# Patient Record
Sex: Female | Born: 1985 | Race: Black or African American | Hispanic: No | Marital: Single | Smoking: Former smoker
Health system: Southern US, Community
[De-identification: ages and names within clinical notes are randomized; demographics above are authoritative.]

## PROBLEM LIST (undated history)

## (undated) DIAGNOSIS — J45909 Unspecified asthma, uncomplicated: Secondary | ICD-10-CM

## (undated) DIAGNOSIS — A749 Chlamydial infection, unspecified: Secondary | ICD-10-CM

## (undated) HISTORY — DX: Chlamydial infection, unspecified: A74.9

## (undated) HISTORY — DX: Unspecified asthma, uncomplicated: J45.909

## (undated) HISTORY — PX: NO PAST SURGERIES: SHX2092

## (undated) HISTORY — PX: INDUCED ABORTION: SHX677

---

## 2004-06-29 ENCOUNTER — Ambulatory Visit (HOSPITAL_COMMUNITY): Admission: RE | Admit: 2004-06-29 | Discharge: 2004-06-29 | Payer: Self-pay | Admitting: Obstetrics & Gynecology

## 2005-04-26 ENCOUNTER — Inpatient Hospital Stay (HOSPITAL_COMMUNITY): Admission: AD | Admit: 2005-04-26 | Discharge: 2005-04-26 | Payer: Self-pay | Admitting: Obstetrics and Gynecology

## 2005-08-02 LAB — CONVERTED CEMR LAB: Pap Smear: NORMAL

## 2005-11-19 ENCOUNTER — Inpatient Hospital Stay (HOSPITAL_COMMUNITY): Admission: AD | Admit: 2005-11-19 | Discharge: 2005-11-19 | Payer: Self-pay | Admitting: *Deleted

## 2006-02-28 ENCOUNTER — Ambulatory Visit (HOSPITAL_COMMUNITY): Admission: RE | Admit: 2006-02-28 | Discharge: 2006-02-28 | Payer: Self-pay | Admitting: Obstetrics

## 2006-03-26 ENCOUNTER — Inpatient Hospital Stay (HOSPITAL_COMMUNITY): Admission: AD | Admit: 2006-03-26 | Discharge: 2006-03-26 | Payer: Self-pay | Admitting: Obstetrics & Gynecology

## 2006-03-27 ENCOUNTER — Inpatient Hospital Stay (HOSPITAL_COMMUNITY): Admission: AD | Admit: 2006-03-27 | Discharge: 2006-03-27 | Payer: Self-pay | Admitting: Obstetrics & Gynecology

## 2006-05-18 ENCOUNTER — Inpatient Hospital Stay (HOSPITAL_COMMUNITY): Admission: AD | Admit: 2006-05-18 | Discharge: 2006-05-18 | Payer: Self-pay | Admitting: Obstetrics

## 2006-07-15 ENCOUNTER — Inpatient Hospital Stay (HOSPITAL_COMMUNITY): Admission: AD | Admit: 2006-07-15 | Discharge: 2006-07-15 | Payer: Self-pay | Admitting: Obstetrics

## 2006-07-17 ENCOUNTER — Inpatient Hospital Stay (HOSPITAL_COMMUNITY): Admission: AD | Admit: 2006-07-17 | Discharge: 2006-07-17 | Payer: Self-pay | Admitting: Obstetrics

## 2006-07-18 ENCOUNTER — Inpatient Hospital Stay (HOSPITAL_COMMUNITY): Admission: AD | Admit: 2006-07-18 | Discharge: 2006-07-20 | Payer: Self-pay | Admitting: Obstetrics

## 2007-06-19 ENCOUNTER — Emergency Department (HOSPITAL_COMMUNITY): Admission: EM | Admit: 2007-06-19 | Discharge: 2007-06-19 | Payer: Self-pay | Admitting: Family Medicine

## 2007-08-28 ENCOUNTER — Ambulatory Visit: Payer: Self-pay | Admitting: Family Medicine

## 2007-08-28 ENCOUNTER — Encounter (INDEPENDENT_AMBULATORY_CARE_PROVIDER_SITE_OTHER): Payer: Self-pay | Admitting: *Deleted

## 2007-08-28 DIAGNOSIS — N912 Amenorrhea, unspecified: Secondary | ICD-10-CM

## 2007-08-28 DIAGNOSIS — D509 Iron deficiency anemia, unspecified: Secondary | ICD-10-CM

## 2007-08-28 DIAGNOSIS — R519 Headache, unspecified: Secondary | ICD-10-CM | POA: Insufficient documentation

## 2007-08-28 DIAGNOSIS — M4120 Other idiopathic scoliosis, site unspecified: Secondary | ICD-10-CM | POA: Insufficient documentation

## 2007-08-28 DIAGNOSIS — R197 Diarrhea, unspecified: Secondary | ICD-10-CM | POA: Insufficient documentation

## 2007-08-28 DIAGNOSIS — R51 Headache: Secondary | ICD-10-CM

## 2007-08-28 DIAGNOSIS — J45909 Unspecified asthma, uncomplicated: Secondary | ICD-10-CM | POA: Insufficient documentation

## 2007-08-28 LAB — CONVERTED CEMR LAB: Beta hcg, urine, semiquantitative: NEGATIVE

## 2007-08-29 ENCOUNTER — Encounter: Payer: Self-pay | Admitting: Family Medicine

## 2007-08-29 LAB — CONVERTED CEMR LAB
ALT: 16 units/L (ref 0–35)
AST: 18 units/L (ref 0–37)
Albumin: 4 g/dL (ref 3.5–5.2)
Alkaline Phosphatase: 44 units/L (ref 39–117)
BUN: 4 mg/dL — ABNORMAL LOW (ref 6–23)
Basophils Absolute: 0 10*3/uL (ref 0.0–0.1)
Basophils Relative: 0.4 % (ref 0.0–1.0)
Bilirubin, Direct: 0.1 mg/dL (ref 0.0–0.3)
CO2: 30 meq/L (ref 19–32)
Calcium: 9.7 mg/dL (ref 8.4–10.5)
Chloride: 102 meq/L (ref 96–112)
Cholesterol: 133 mg/dL (ref 0–200)
Creatinine, Ser: 0.7 mg/dL (ref 0.4–1.2)
Eosinophils Absolute: 0.1 10*3/uL (ref 0.0–0.6)
Eosinophils Relative: 2.4 % (ref 0.0–5.0)
GFR calc Af Amer: 133 mL/min
GFR calc non Af Amer: 110 mL/min
Glucose, Bld: 102 mg/dL — ABNORMAL HIGH (ref 70–99)
HCT: 41.1 % (ref 36.0–46.0)
HDL: 38.4 mg/dL — ABNORMAL LOW (ref >39.0)
Hemoglobin: 13.7 g/dL (ref 12.0–15.0)
LDL Cholesterol: 85 mg/dL (ref 0–99)
Lymphocytes Relative: 43.5 % (ref 12.0–46.0)
MCHC: 33.4 g/dL (ref 30.0–36.0)
MCV: 96.7 fL (ref 78.0–100.0)
Monocytes Absolute: 0.3 10*3/uL (ref 0.2–0.7)
Monocytes Relative: 7 % (ref 3.0–11.0)
Neutro Abs: 2 10*3/uL (ref 1.4–7.7)
Neutrophils Relative %: 46.7 % (ref 43.0–77.0)
Platelets: 263 10*3/uL (ref 150–400)
Potassium: 4.4 meq/L (ref 3.5–5.1)
RBC: 4.26 M/uL (ref 3.87–5.11)
RDW: 11.8 % (ref 11.5–14.6)
Sodium: 138 meq/L (ref 135–145)
TSH: 0.54 microintl units/mL (ref 0.35–5.50)
Total Bilirubin: 0.7 mg/dL (ref 0.3–1.2)
Total CHOL/HDL Ratio: 3.5
Total Protein: 7.5 g/dL (ref 6.0–8.3)
Triglycerides: 49 mg/dL (ref 0–149)
VLDL: 10 mg/dL (ref 0–40)
WBC: 4.3 10*3/uL — ABNORMAL LOW (ref 4.5–10.5)

## 2007-08-30 ENCOUNTER — Encounter (INDEPENDENT_AMBULATORY_CARE_PROVIDER_SITE_OTHER): Payer: Self-pay | Admitting: *Deleted

## 2007-09-08 ENCOUNTER — Ambulatory Visit: Payer: Self-pay | Admitting: Family Medicine

## 2007-09-08 DIAGNOSIS — K589 Irritable bowel syndrome without diarrhea: Secondary | ICD-10-CM

## 2008-05-02 ENCOUNTER — Emergency Department (HOSPITAL_COMMUNITY): Admission: EM | Admit: 2008-05-02 | Discharge: 2008-05-02 | Payer: Self-pay | Admitting: Family Medicine

## 2008-11-18 ENCOUNTER — Emergency Department (HOSPITAL_COMMUNITY): Admission: EM | Admit: 2008-11-18 | Discharge: 2008-11-18 | Payer: Self-pay | Admitting: Emergency Medicine

## 2008-11-19 ENCOUNTER — Ambulatory Visit: Payer: Self-pay | Admitting: Family Medicine

## 2008-11-19 DIAGNOSIS — R062 Wheezing: Secondary | ICD-10-CM

## 2008-11-20 ENCOUNTER — Encounter (INDEPENDENT_AMBULATORY_CARE_PROVIDER_SITE_OTHER): Payer: Self-pay | Admitting: Internal Medicine

## 2008-11-20 ENCOUNTER — Ambulatory Visit: Payer: Self-pay | Admitting: Family Medicine

## 2008-11-20 LAB — CONVERTED CEMR LAB
Basophils Absolute: 0 10*3/uL (ref 0.0–0.1)
Basophils Relative: 0 % (ref 0.0–3.0)
Eosinophils Absolute: 0 10*3/uL (ref 0.0–0.7)
Eosinophils Relative: 0.3 % (ref 0.0–5.0)
HCT: 37.8 % (ref 36.0–46.0)
Hemoglobin: 13 g/dL (ref 12.0–15.0)
Lymphocytes Relative: 26.2 % (ref 12.0–46.0)
Lymphs Abs: 3.2 10*3/uL (ref 0.7–4.0)
MCHC: 34.5 g/dL (ref 30.0–36.0)
MCV: 97.9 fL (ref 78.0–100.0)
Monocytes Absolute: 0.7 10*3/uL (ref 0.1–1.0)
Monocytes Relative: 5.4 % (ref 3.0–12.0)
Neutro Abs: 8.2 10*3/uL — ABNORMAL HIGH (ref 1.4–7.7)
Neutrophils Relative %: 68.1 % (ref 43.0–77.0)
Platelets: 290 10*3/uL (ref 150.0–400.0)
RBC: 3.86 M/uL — ABNORMAL LOW (ref 3.87–5.11)
RDW: 11.8 % (ref 11.5–14.6)
WBC: 12.1 10*3/uL — ABNORMAL HIGH (ref 4.5–10.5)

## 2008-11-21 ENCOUNTER — Ambulatory Visit: Payer: Self-pay | Admitting: Family Medicine

## 2008-11-22 ENCOUNTER — Telehealth (INDEPENDENT_AMBULATORY_CARE_PROVIDER_SITE_OTHER): Payer: Self-pay | Admitting: Internal Medicine

## 2008-11-25 ENCOUNTER — Telehealth (INDEPENDENT_AMBULATORY_CARE_PROVIDER_SITE_OTHER): Payer: Self-pay | Admitting: Internal Medicine

## 2008-11-26 ENCOUNTER — Ambulatory Visit: Payer: Self-pay | Admitting: Family Medicine

## 2008-11-29 ENCOUNTER — Telehealth (INDEPENDENT_AMBULATORY_CARE_PROVIDER_SITE_OTHER): Payer: Self-pay | Admitting: Internal Medicine

## 2008-12-12 ENCOUNTER — Telehealth (INDEPENDENT_AMBULATORY_CARE_PROVIDER_SITE_OTHER): Payer: Self-pay | Admitting: Internal Medicine

## 2008-12-19 ENCOUNTER — Telehealth (INDEPENDENT_AMBULATORY_CARE_PROVIDER_SITE_OTHER): Payer: Self-pay | Admitting: Internal Medicine

## 2009-01-07 ENCOUNTER — Encounter (INDEPENDENT_AMBULATORY_CARE_PROVIDER_SITE_OTHER): Payer: Self-pay | Admitting: Internal Medicine

## 2009-01-13 ENCOUNTER — Telehealth: Payer: Self-pay | Admitting: Family Medicine

## 2009-01-15 ENCOUNTER — Ambulatory Visit: Payer: Self-pay | Admitting: Family Medicine

## 2009-01-29 ENCOUNTER — Telehealth (INDEPENDENT_AMBULATORY_CARE_PROVIDER_SITE_OTHER): Payer: Self-pay | Admitting: Internal Medicine

## 2011-06-30 ENCOUNTER — Emergency Department: Payer: Self-pay | Admitting: Unknown Physician Specialty

## 2011-07-29 ENCOUNTER — Encounter: Payer: Self-pay | Admitting: Physician Assistant

## 2011-08-03 ENCOUNTER — Encounter: Payer: Self-pay | Admitting: Physician Assistant

## 2011-08-07 ENCOUNTER — Ambulatory Visit: Payer: Self-pay | Admitting: Physician Assistant

## 2011-09-03 ENCOUNTER — Encounter: Payer: Self-pay | Admitting: Physician Assistant

## 2011-10-01 ENCOUNTER — Encounter: Payer: Self-pay | Admitting: Physician Assistant

## 2011-11-01 ENCOUNTER — Encounter: Payer: Self-pay | Admitting: Physician Assistant

## 2012-04-20 ENCOUNTER — Other Ambulatory Visit: Payer: Self-pay | Admitting: Obstetrics

## 2012-11-29 ENCOUNTER — Ambulatory Visit (INDEPENDENT_AMBULATORY_CARE_PROVIDER_SITE_OTHER): Payer: 59 | Admitting: Obstetrics

## 2012-11-29 ENCOUNTER — Encounter: Payer: Self-pay | Admitting: Obstetrics

## 2012-11-29 VITALS — BP 125/76 | HR 83 | Temp 97.9°F | Ht 67.5 in | Wt 148.0 lb

## 2012-11-29 DIAGNOSIS — N760 Acute vaginitis: Secondary | ICD-10-CM | POA: Insufficient documentation

## 2012-11-29 DIAGNOSIS — Z113 Encounter for screening for infections with a predominantly sexual mode of transmission: Secondary | ICD-10-CM

## 2012-11-29 MED ORDER — METRONIDAZOLE 500 MG PO TABS: 500.0000 mg | ORAL_TABLET | Freq: Two times a day (BID) | ORAL | Status: DC

## 2012-11-29 NOTE — Progress Notes (Signed)
Subjective:     Roberta Farley is a 27 y.o. female here for a problem visit.  Current complaints: vaginal discharge with odor x 1 week.  Personal health questionnaire reviewed: yes.   Gynecologic History No LMP recorded. Patient has had an injection. Contraception: Depo-Provera injections   The following portions of the patient's history were reviewed and updated as appropriate: allergies, current medications, past family history, past medical history, past social history, past surgical history and problem list.  Review of Systems Pertinent items are noted in HPI.    Objective:    General appearance: alert and no distress Abdomen: normal findings: soft, non-tender Pelvic: cervix normal in appearance, external genitalia normal, no adnexal masses or tenderness, no cervical motion tenderness, uterus normal size, shape, and consistency and vagina with thin, gray discharge    Assessment:    BV  Plan:    Education reviewed: safe sex/STD prevention and BV. Follow up in: 1 year.   Flagyl Rx

## 2012-11-30 LAB — WET PREP BY MOLECULAR PROBE
Candida species: NEGATIVE
Gardnerella vaginalis: POSITIVE — AB
Trichomonas vaginosis: NEGATIVE

## 2012-11-30 LAB — GC/CHLAMYDIA PROBE AMP
CT Probe RNA: NEGATIVE
GC Probe RNA: NEGATIVE

## 2012-11-30 NOTE — Patient Instructions (Signed)
+  BV

## 2012-12-11 ENCOUNTER — Ambulatory Visit: Payer: Self-pay | Admitting: Obstetrics

## 2012-12-18 ENCOUNTER — Ambulatory Visit (INDEPENDENT_AMBULATORY_CARE_PROVIDER_SITE_OTHER): Payer: 59 | Admitting: *Deleted

## 2012-12-18 ENCOUNTER — Encounter: Payer: Self-pay | Admitting: Obstetrics

## 2012-12-18 VITALS — BP 124/78 | HR 83 | Temp 98.0°F | Ht 68.0 in | Wt 149.0 lb

## 2012-12-18 DIAGNOSIS — Z3049 Encounter for surveillance of other contraceptives: Secondary | ICD-10-CM

## 2012-12-18 MED ORDER — MEDROXYPROGESTERONE ACETATE 150 MG/ML IM SUSP
150.0000 mg | INTRAMUSCULAR | Status: DC
  Administered 2012-12-18: 150 mg via INTRAMUSCULAR

## 2012-12-18 NOTE — Patient Instructions (Signed)
Please return to office as schedule and as needed. Please call pharmacy for refills and pick-up prescription before next appointment.  

## 2012-12-18 NOTE — Progress Notes (Signed)
Pt tolerated well. Pt given results of recent PAP and advised to schedule colposcopy.

## 2012-12-26 ENCOUNTER — Other Ambulatory Visit: Payer: Self-pay | Admitting: *Deleted

## 2012-12-26 DIAGNOSIS — B379 Candidiasis, unspecified: Secondary | ICD-10-CM

## 2012-12-26 MED ORDER — FLUCONAZOLE 150 MG PO TABS: 150.0000 mg | ORAL_TABLET | Freq: Once | ORAL | Status: DC

## 2012-12-26 NOTE — Progress Notes (Signed)
Patient called regarding a yeast infection.  Patient says that she has an abnormal discharge with vaginal irritation and itching.  Per patient request - Diflucan 150mg  PO X 1 sent to [facility].

## 2013-01-15 ENCOUNTER — Encounter: Payer: Self-pay | Admitting: Obstetrics

## 2013-01-15 ENCOUNTER — Ambulatory Visit (INDEPENDENT_AMBULATORY_CARE_PROVIDER_SITE_OTHER): Payer: 59 | Admitting: Obstetrics

## 2013-01-15 VITALS — BP 119/77 | HR 89 | Temp 98.8°F | Ht 68.0 in | Wt 148.0 lb

## 2013-01-15 DIAGNOSIS — N760 Acute vaginitis: Secondary | ICD-10-CM

## 2013-01-15 DIAGNOSIS — Z3202 Encounter for pregnancy test, result negative: Secondary | ICD-10-CM

## 2013-01-15 DIAGNOSIS — R87612 Low grade squamous intraepithelial lesion on cytologic smear of cervix (LGSIL): Secondary | ICD-10-CM | POA: Insufficient documentation

## 2013-01-15 LAB — POCT URINE PREGNANCY: Preg Test, Ur: NEGATIVE

## 2013-01-15 NOTE — Progress Notes (Signed)
.  Colposcopy Procedure Note  Indications: Pap smear 4 months ago showed: low-grade squamous intraepithelial neoplasia (LGSIL - encompassing HPV,mild dysplasia,CIN I). The prior pap showed no abnormalities.  Prior cervical/vaginal disease: normal exam without visible pathology. Prior cervical treatment: no treatment.  Procedure Details  The risks and benefits of the procedure and Written informed consent obtained.  A time-out was performed confirming the patient, procedure and allergy status  Speculum placed in vagina and excellent visualization of cervix achieved, cervix swabbed x 3 with acetic acid solution.  Findings: Cervix: no visible lesions; SCJ visualized 360 degrees without lesions, endocervical curettage performed, cervical biopsies taken at 3 and 12 o'clock, specimen labelled and sent to pathology and hemostasis achieved with silver nitrate.   Vaginal inspection: normal without visible lesions. Vulvar colposcopy: normal mucosa without lesions.    Specimens: ECC and Cervical Biopsies  Complications: none.  Plan: Specimens labelled and sent to Pathology. Will base further treatment on Pathology findings. Post biopsy instructions given to patient. Return to discuss Pathology results in 2 weeks. LMP- 01/01/13.

## 2013-01-15 NOTE — Addendum Note (Signed)
Addended by: Julaine Hua on: 01/15/2013 05:57 PM   Modules accepted: Orders

## 2013-01-17 LAB — WET PREP BY MOLECULAR PROBE
Candida species: NEGATIVE
Gardnerella vaginalis: NEGATIVE
Trichomonas vaginosis: NEGATIVE

## 2013-01-25 ENCOUNTER — Encounter: Payer: Self-pay | Admitting: Obstetrics

## 2013-01-25 ENCOUNTER — Ambulatory Visit (INDEPENDENT_AMBULATORY_CARE_PROVIDER_SITE_OTHER): Payer: 59 | Admitting: Obstetrics

## 2013-01-25 VITALS — BP 129/85 | HR 58 | Temp 98.5°F | Wt 146.8 lb

## 2013-01-25 DIAGNOSIS — B9689 Other specified bacterial agents as the cause of diseases classified elsewhere: Secondary | ICD-10-CM

## 2013-01-25 DIAGNOSIS — N870 Mild cervical dysplasia: Secondary | ICD-10-CM | POA: Insufficient documentation

## 2013-01-25 DIAGNOSIS — N760 Acute vaginitis: Secondary | ICD-10-CM

## 2013-01-25 DIAGNOSIS — A499 Bacterial infection, unspecified: Secondary | ICD-10-CM

## 2013-01-25 DIAGNOSIS — R109 Unspecified abdominal pain: Secondary | ICD-10-CM

## 2013-01-25 LAB — POCT URINALYSIS DIPSTICK
Bilirubin, UA: NEGATIVE
Glucose, UA: NEGATIVE
Ketones, UA: NEGATIVE
Leukocytes, UA: NEGATIVE
Spec Grav, UA: 1.02
Urobilinogen, UA: NEGATIVE
pH, UA: 5.5

## 2013-01-25 MED ORDER — METRONIDAZOLE 500 MG PO TABS: 500.0000 mg | ORAL_TABLET | Freq: Two times a day (BID) | ORAL | Status: DC

## 2013-01-25 MED ORDER — DOXYCYCLINE HYCLATE 50 MG PO CAPS: 50.0000 mg | ORAL_CAPSULE | Freq: Two times a day (BID) | ORAL | Status: DC

## 2013-01-25 NOTE — Progress Notes (Signed)
Subjective:     Roberta Farley is a 27 y.o. female here for problem visit.  Current complaints: vaginal bleeding and intermittent lower abdominal pain x 10 days.  Personal health questionnaire reviewed: not asked.   Gynecologic History No LMP recorded. Contraception: Depo-Provera injections   Obstetric History OB History   Grav Para Term Preterm Abortions TAB SAB Ect Mult Living   1              # Outc Date GA Lbr Len/2nd Wgt Sex Del Anes PTL Lv   1 GRA                The following portions of the patient's history were reviewed and updated as appropriate: allergies, current medications, past family history, past medical history, past social history, past surgical history and problem list.  Review of Systems Pertinent items are noted in HPI.    Objective:    General appearance: alert and no distress Abdomen: normal findings: soft, non-tender Pelvic: cervix normal in appearance, external genitalia normal, no adnexal masses or tenderness, no cervical motion tenderness, uterus normal size, shape, and consistency and vagina normal without discharge    Assessment:    Healthy female exam.   H/O CIN 1.    BV   Plan:    Education reviewed: safe sex/STD prevention and management of dysplasia. Follow up in: 1 year.   Pap and Annual. Doxycycline / Flagyl Rx.

## 2013-02-01 ENCOUNTER — Ambulatory Visit: Payer: 59 | Admitting: Obstetrics

## 2013-03-12 ENCOUNTER — Ambulatory Visit: Payer: 59

## 2013-05-30 ENCOUNTER — Emergency Department (INDEPENDENT_AMBULATORY_CARE_PROVIDER_SITE_OTHER)
Admission: EM | Admit: 2013-05-30 | Discharge: 2013-05-30 | Disposition: A | Discharge status: Home / Self Care | Payer: 59 | Source: Home / Self Care | Attending: Family Medicine | Admitting: Family Medicine

## 2013-05-30 ENCOUNTER — Encounter (HOSPITAL_COMMUNITY): Payer: Self-pay | Admitting: Emergency Medicine

## 2013-05-30 DIAGNOSIS — S83002A Unspecified subluxation of left patella, initial encounter: Secondary | ICD-10-CM

## 2013-05-30 DIAGNOSIS — S83006A Unspecified dislocation of unspecified patella, initial encounter: Secondary | ICD-10-CM

## 2013-05-30 MED ORDER — MELOXICAM 7.5 MG PO TABS: 7.5000 mg | ORAL_TABLET | Freq: Two times a day (BID) | ORAL | Status: DC
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
## 2013-05-30 NOTE — ED Notes (Signed)
C/o knee pain which started today.   As patient was trying stand up a sharp came through knee which  Made patient unable to stand.  Pain lasted for thirty minutes.  Ibuprofen and ice treatments  Was used with therapy of 15 minutes iced and 15 minutes un iced.

## 2013-06-11 ENCOUNTER — Encounter: Payer: Self-pay | Admitting: Physician Assistant

## 2013-06-11 ENCOUNTER — Ambulatory Visit (INDEPENDENT_AMBULATORY_CARE_PROVIDER_SITE_OTHER): Payer: 59 | Admitting: Physician Assistant

## 2013-06-11 VITALS — BP 110/70 | HR 91 | Temp 98.8°F | Ht 68.0 in | Wt 160.0 lb

## 2013-06-11 DIAGNOSIS — Z299 Encounter for prophylactic measures, unspecified: Secondary | ICD-10-CM

## 2013-06-11 DIAGNOSIS — N926 Irregular menstruation, unspecified: Secondary | ICD-10-CM

## 2013-06-11 DIAGNOSIS — Z7189 Other specified counseling: Secondary | ICD-10-CM

## 2013-06-11 DIAGNOSIS — J45909 Unspecified asthma, uncomplicated: Secondary | ICD-10-CM

## 2013-06-11 DIAGNOSIS — K589 Irritable bowel syndrome without diarrhea: Secondary | ICD-10-CM

## 2013-06-11 DIAGNOSIS — N949 Unspecified condition associated with female genital organs and menstrual cycle: Secondary | ICD-10-CM

## 2013-06-11 DIAGNOSIS — M4120 Other idiopathic scoliosis, site unspecified: Secondary | ICD-10-CM

## 2013-06-11 DIAGNOSIS — Z7689 Persons encountering health services in other specified circumstances: Secondary | ICD-10-CM

## 2013-06-11 MED ORDER — ALBUTEROL SULFATE HFA 108 (90 BASE) MCG/ACT IN AERS: 1.0000 | INHALATION_SPRAY | Freq: Four times a day (QID) | RESPIRATORY_TRACT | Status: DC | PRN

## 2013-06-11 NOTE — Assessment & Plan Note (Signed)
Encourage probiotic and fiber supplement.  Will obtain CBC, BMP, TSH to assess other causes of constipation.

## 2013-06-11 NOTE — Progress Notes (Signed)
Patient ID: Roberta Farley, female   DOB: 03-Oct-1985, 27 y.o.   MRN: [id number]  Patient presents to clinic today to establish care.  Acute Concerns: Patient is requesting wellness forms to be filled out for her work upon completion of lab tests.  Chronic Issues: (1) Asthma -- mild, intermittent.  Is out of albuterol inhaler.  States she has not needed medication in over half a year.  Endorses flares 2 x year.  No nighttime symptoms.  (2) Irritable Bowel Syndrome -- constipation predominant.  Endorses eating healthy.  Does not take probiotic or fiber supplementation.  Patient occasionally uses stool softener or laxative for bowel movement.  Denies hematochezia, melena or tenesmus.  Abdominal discomfort is diffuse and improves with defecation.  (3) Scoliosis -- moderate-severe.  Followed by orthopedics.  Refraining from surgery due to location of curvature.  Health Maintenance: Dental -- has appointment today Vision -- UTD Immunizations -- UTD.  Tetanus 2012.  Flu shot last week.   Past Medical History  Diagnosis Date  . Chlamydia   . Asthma     Current Outpatient Prescriptions on File Prior to Visit  Medication Sig Dispense Refill  . fluconazole (DIFLUCAN) 150 MG tablet Take 1 tablet (150 mg total) by mouth once.  1 tablet  1  . meloxicam (MOBIC) 7.5 MG tablet Take 1 tablet (7.5 mg total) by mouth 2 (two) times daily after a meal.  30 tablet  1  . prenatal vitamin w/FE, FA (PRENATAL 1 + 1) 27-1 MG TABS Take 1 tablet by mouth daily.        No current facility-administered medications on file prior to visit.    No Known Allergies  Family History  Problem Relation Age of Onset  . Hypertension    . Congestive Heart Failure    . Diabetes    . Ovarian cancer Maternal Grandmother   . Cancer Maternal Grandmother   . Diabetes Mother   . Hypertension Mother   . Hyperlipidemia Father   . Diabetes Father     History   Social History  . Marital Status: Single    Spouse  Name: N/A    Number of Children: 1  . Years of Education: N/A   Occupational History  . CNA    Social History Main Topics  . Smoking status: Former Smoker    Quit date: 06/11/2013  . Smokeless tobacco: Never Used     Comment: "Hookah" tobacco    . Alcohol Use: Yes     Comment: "socially"  . Drug Use: No  . Sexual Activity: Yes    Partners: Male    Birth Control/ Protection: Injection, None   Other Topics Concern  . None   Social History Narrative  . None   Review of Systems  Constitutional: Negative for fever, chills, weight loss and malaise/fatigue.  HENT: Negative for ear discharge, ear pain, hearing loss and tinnitus.   Eyes: Negative for blurred vision, double vision, photophobia and pain.  Respiratory: Negative for cough, shortness of breath and wheezing.   Cardiovascular: Negative for chest pain and palpitations.  Gastrointestinal: Positive for abdominal pain and constipation. Negative for heartburn, nausea, vomiting, diarrhea, blood in stool and melena.  Genitourinary: Negative for dysuria, urgency, frequency, hematuria and flank pain.  Neurological: Negative for dizziness, seizures, loss of consciousness and headaches.  Endo/Heme/Allergies: Positive for environmental allergies.  Psychiatric/Behavioral: Negative for depression, suicidal ideas, hallucinations and substance abuse. The patient is not nervous/anxious and does not have insomnia.  Filed Vitals:   06/11/13 1313  BP: 110/70  Pulse: 91  Temp: 98.8 F (37.1 C)    Physical Exam  Vitals reviewed. Constitutional: She is oriented to person, place, and time and well-developed, well-nourished, and in no distress.  HENT:  Head: Normocephalic and atraumatic.  Right Ear: External ear normal.  Left Ear: External ear normal.  Nose: Nose normal.  Mouth/Throat: Oropharynx is clear and moist. No oropharyngeal exudate.  Tympanic membranes within normal limits.  Eyes: Conjunctivae and EOM are normal. Pupils are  equal, round, and reactive to light.  Neck: Normal range of motion. Neck supple. No thyromegaly present.  Cardiovascular: Normal rate, regular rhythm, normal heart sounds and intact distal pulses.   Pulmonary/Chest: Effort normal and breath sounds normal. No respiratory distress. She has no wheezes. She has no rales. She exhibits no tenderness.  Abdominal: Soft. Bowel sounds are normal. She exhibits no distension and no mass. There is no tenderness. There is no rebound and no guarding.  Musculoskeletal:  Moderate scoliosis of spine noted on exam.  Lymphadenopathy:    She has no cervical adenopathy.  Neurological: She is alert and oriented to person, place, and time. No cranial nerve deficit.  Skin: Skin is warm and dry. No rash noted.  Psychiatric: Affect normal.   Assessment/Plan: ASTHMA, INTERMITTENT, MILD Refill albuterol inhaler.  IBS Encourage probiotic and fiber supplement.  Will obtain CBC, BMP, TSH to assess other causes of constipation.  SCOLIOSIS, THORACIC SPINE Followed by Orthopedics.  Encounter to establish care Will obtain fasting labs to also include urine pregnancy and serum nicotine for patient's work wellness forms.  Health maintenance UTD.

## 2013-06-11 NOTE — Assessment & Plan Note (Signed)
Refill albuterol inhaler ?

## 2013-06-11 NOTE — Patient Instructions (Signed)
Please return for labs.  I will call you with your results.  I will also fill out your wellness forms once I have your results.  Please begin a probiotic (Align) and fiber supplement to help with stomach.  It was a pleasure participating in your care today.

## 2013-06-11 NOTE — Assessment & Plan Note (Signed)
-  Followed by Orthopedics. ?

## 2013-06-11 NOTE — Progress Notes (Signed)
Pre visit review using our clinic review tool, if applicable. No additional management support is needed unless otherwise documented below in the visit note. 

## 2013-06-11 NOTE — Assessment & Plan Note (Signed)
Will obtain fasting labs to also include urine pregnancy and serum nicotine for patient's work wellness forms.  Health maintenance UTD.

## 2013-06-12 LAB — CBC WITH DIFFERENTIAL/PLATELET
Basophils Absolute: 0 10*3/uL (ref 0.0–0.1)
Basophils Relative: 0 % (ref 0–1)
Eosinophils Absolute: 0.1 10*3/uL (ref 0.0–0.7)
Eosinophils Relative: 2 % (ref 0–5)
HCT: 38.7 % (ref 36.0–46.0)
Hemoglobin: 13.3 g/dL (ref 12.0–15.0)
Lymphocytes Relative: 43 % (ref 12–46)
Lymphs Abs: 1.8 10*3/uL (ref 0.7–4.0)
MCH: 32.4 pg (ref 26.0–34.0)
MCHC: 34.4 g/dL (ref 30.0–36.0)
MCV: 94.2 fL (ref 78.0–100.0)
Monocytes Absolute: 0.2 10*3/uL (ref 0.1–1.0)
Monocytes Relative: 4 % (ref 3–12)
Neutro Abs: 2.1 10*3/uL (ref 1.7–7.7)
Neutrophils Relative %: 51 % (ref 43–77)
Platelets: 255 10*3/uL (ref 150–400)
RBC: 4.11 MIL/uL (ref 3.87–5.11)
RDW: 12.7 % (ref 11.5–15.5)
WBC: 4.2 10*3/uL (ref 4.0–10.5)

## 2013-06-12 LAB — BASIC METABOLIC PANEL
BUN: 9 mg/dL (ref 6–23)
CO2: 25 mEq/L (ref 19–32)
Calcium: 9.2 mg/dL (ref 8.4–10.5)
Chloride: 105 mEq/L (ref 96–112)
Creat: 0.85 mg/dL (ref 0.50–1.10)
Glucose, Bld: 107 mg/dL — ABNORMAL HIGH (ref 70–99)
Potassium: 4.4 mEq/L (ref 3.5–5.3)
Sodium: 138 mEq/L (ref 135–145)

## 2013-06-12 LAB — HEPATIC FUNCTION PANEL
ALT: 16 U/L (ref 0–35)
AST: 18 U/L (ref 0–37)
Albumin: 4.3 g/dL (ref 3.5–5.2)
Alkaline Phosphatase: 36 U/L — ABNORMAL LOW (ref 39–117)
Bilirubin, Direct: 0.2 mg/dL (ref 0.0–0.3)
Indirect Bilirubin: 0.4 mg/dL (ref 0.0–0.9)
Total Bilirubin: 0.6 mg/dL (ref 0.3–1.2)
Total Protein: 6.9 g/dL (ref 6.0–8.3)

## 2013-06-12 LAB — HEMOGLOBIN A1C
Hgb A1c MFr Bld: 5.6 % (ref <5.7)
Mean Plasma Glucose: 114 mg/dL (ref <117)

## 2013-06-12 LAB — LIPID PANEL
Cholesterol: 125 mg/dL (ref 0–200)
HDL: 50 mg/dL (ref >39)
LDL Cholesterol: 67 mg/dL (ref 0–99)
Total CHOL/HDL Ratio: 2.5 Ratio
Triglycerides: 40 mg/dL (ref <150)
VLDL: 8 mg/dL (ref 0–40)

## 2013-06-12 LAB — TSH: TSH: 0.632 u[IU]/mL (ref 0.350–4.500)

## 2013-06-13 ENCOUNTER — Telehealth: Payer: Self-pay

## 2013-06-13 LAB — URINALYSIS, ROUTINE W REFLEX MICROSCOPIC
Bilirubin Urine: NEGATIVE
Glucose, UA: NEGATIVE mg/dL
Leukocytes, UA: NEGATIVE
Nitrite: NEGATIVE
Protein, ur: 100 mg/dL — AB
Specific Gravity, Urine: 1.022 (ref 1.005–1.030)
Urobilinogen, UA: 1 mg/dL (ref 0.0–1.0)
pH: 6 (ref 5.0–8.0)

## 2013-06-13 LAB — NICOTINE/COTININE METABOLITES: Cotinine: <10 ng/mL

## 2013-06-13 LAB — URINALYSIS, MICROSCOPIC ONLY: Casts: NONE SEEN

## 2013-06-13 LAB — PREGNANCY, URINE: Preg Test, Ur: NEGATIVE

## 2013-06-13 NOTE — Telephone Encounter (Signed)
Patient informed of results. Patient stated that she has not started her period since last office visit. Patient instructed to make another appointment in 1-2 weeks as long as she is not on her period for her urine to be rechecked.

## 2013-06-13 NOTE — Telephone Encounter (Signed)
Message copied by Eulis Manly on Wed Jun 13, 2013 10:44 AM ------      Message from: Marcelline Mates      Created: Wed Jun 13, 2013  8:20 AM       Please inform patient that overall her labs look good.  I am still waiting on the result of nicotine levels.  Her urine pregnancy was negative.  However, her urine showed a moderate amount of blood under the microscope.  I am not sure if she has started her period since last visit.  I would like for her to return in 1-2 weeks (while not having period) for [place] to recheck her urine to make sure it is clear of blood. To reassure her, most of the time when we recheck urine it is normal. ------

## 2013-06-13 NOTE — Telephone Encounter (Signed)
Left message for patient requesting a call back to discuss labs.

## 2013-06-14 ENCOUNTER — Telehealth: Payer: Self-pay | Admitting: Physician Assistant

## 2013-06-14 ENCOUNTER — Other Ambulatory Visit: Payer: Self-pay | Admitting: Physician Assistant

## 2013-06-14 DIAGNOSIS — Z299 Encounter for prophylactic measures, unspecified: Secondary | ICD-10-CM

## 2013-06-14 NOTE — Telephone Encounter (Signed)
Reorder of serum nicotine/continine metabolites due to error with specimen handling by Lab.

## 2013-06-15 ENCOUNTER — Telehealth: Payer: Self-pay | Admitting: *Deleted

## 2013-06-15 NOTE — Telephone Encounter (Signed)
Patient called RE: lab results being faxed to Insurance; per provider, pt wanted to keep form until results were in & also pt has been contacted by lab to return for another draw on nicotine lab d/t courier error on first test. [facility] with contact name and number RE: this matter/SLS

## 2013-06-22 LAB — NICOTINE/COTININE METABOLITES: Cotinine: <10 ng/mL

## 2013-07-10 ENCOUNTER — Telehealth: Payer: Self-pay | Admitting: *Deleted

## 2013-07-10 DIAGNOSIS — R319 Hematuria, unspecified: Secondary | ICD-10-CM

## 2013-07-10 NOTE — Telephone Encounter (Signed)
Pt returned your call.  

## 2013-07-10 NOTE — Telephone Encounter (Signed)
Received message from pt requesting lab results from November.  Attempted to reach pt and reached recording that voice mailbox is full.

## 2013-07-13 NOTE — Telephone Encounter (Signed)
Spoke with pt. She states she was calling to follow up on her repeat urine that she provided to the lab on 06/21/13.  Apologized to pt as repeat urine testing was not ordered or resulted. Pt states she will return Monday to provide another sample as she still has not had a menstrual cycle since her last visit on 06/11/13. Pt advised to ask for me on Monday to ensure urinalysis is ordered.

## 2013-07-14 ENCOUNTER — Encounter: Payer: Self-pay | Admitting: Internal Medicine

## 2013-07-14 ENCOUNTER — Ambulatory Visit (INDEPENDENT_AMBULATORY_CARE_PROVIDER_SITE_OTHER): Payer: 59 | Admitting: Internal Medicine

## 2013-07-14 VITALS — BP 120/80 | HR 64 | Temp 99.6°F | Ht 68.0 in | Wt 157.0 lb

## 2013-07-14 DIAGNOSIS — J45909 Unspecified asthma, uncomplicated: Secondary | ICD-10-CM

## 2013-07-14 DIAGNOSIS — J019 Acute sinusitis, unspecified: Secondary | ICD-10-CM | POA: Insufficient documentation

## 2013-07-14 MED ORDER — AMOXICILLIN 500 MG PO TABS: 1000.0000 mg | ORAL_TABLET | Freq: Two times a day (BID) | ORAL | Status: DC

## 2013-07-14 MED ORDER — ALBUTEROL SULFATE HFA 108 (90 BASE) MCG/ACT IN AERS: 1.0000 | INHALATION_SPRAY | Freq: Four times a day (QID) | RESPIRATORY_TRACT | Status: DC | PRN

## 2013-07-14 NOTE — Progress Notes (Signed)
   Subjective:    Patient ID: Roberta Farley, female    DOB: 02/16/1986, 27 y.o.   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 meloxicam (MOBIC) 7.5 MG tablet Take 1 tablet (7.5 mg total) by mouth 2 (two) times daily after a meal.  30 tablet  1  . prenatal vitamin w/FE, FA (PRENATAL 1 + 1) 27-1 MG TABS Take 1 tablet by mouth daily.       Marland Kitchen albuterol (PROVENTIL HFA;VENTOLIN HFA) 108 (90 BASE) MCG/ACT inhaler Inhale 1-2 puffs into the lungs every 6 (six) hours as needed for wheezing or shortness of breath.  1 Inhaler  0   No current facility-administered medications on file prior to visit.    No Known Allergies  Past Medical History  Diagnosis Date  . Chlamydia   . Asthma     Past Surgical History  Procedure Laterality Date  . Induced abortion      x 2    Family History  Problem Relation Age of Onset  . Hypertension    . Congestive Heart Failure    . Diabetes    . Ovarian cancer Maternal Grandmother   . Cancer Maternal Grandmother   . Diabetes Mother   . Hypertension Mother   . Hyperlipidemia Father   . Diabetes Father     History   Social History  . Marital Status: Single    Spouse Name: N/A    Number of Children: 1  . Years of Education: N/A   Occupational History  . CNA    Social History Main Topics  . Smoking status: Former Smoker    Quit date: 06/11/2013  . Smokeless tobacco: Never Used     Comment: "Hookah" tobacco    . Alcohol Use: Yes     Comment: "socially"  . Drug Use: No  . Sexual Activity: Yes    Partners: Male    Birth Control/ Protection: Injection, None    Other Topics Concern  . Not on file   Social History Narrative  . No narrative on file   Review of Systems No rash No diarrhea--actually pain with constipation last night Appetite is decreased    Objective:   Physical Exam  Constitutional: She appears well-developed and well-nourished. No distress.  HENT:  Mouth/Throat: Oropharynx is clear and moist. No oropharyngeal exudate.  Mild left frontal tenderness TMs normal Mild nasal inflammation  Neck: Normal range of motion. Neck supple.  Pulmonary/Chest: Effort normal and breath sounds normal. No respiratory distress. She has no wheezes. She has no rales.  Lymphadenopathy:    She has no cervical adenopathy.          Assessment & Plan:

## 2013-07-14 NOTE — Assessment & Plan Note (Signed)
Not particularly exacerbated Will refill the albuterol

## 2013-07-14 NOTE — Progress Notes (Signed)
Pre-visit discussion using our clinic review tool. No additional management support is needed unless otherwise documented below in the visit note.  

## 2013-07-14 NOTE — Assessment & Plan Note (Signed)
Likely still viral Discussed symptomatic Rx If worsens, can fill amoxicillin Rx

## 2013-07-18 NOTE — Telephone Encounter (Signed)
U/A w/micro order placed and forwarded to [facility] for pt return draw/SLS

## 2014-01-15 ENCOUNTER — Encounter (HOSPITAL_COMMUNITY): Payer: Self-pay | Admitting: Emergency Medicine

## 2014-01-15 ENCOUNTER — Inpatient Hospital Stay (HOSPITAL_COMMUNITY)
Admission: EM | Admit: 2014-01-15 | Discharge: 2014-01-19 | DRG: 872 | Disposition: A | Discharge status: Home / Self Care | Payer: 59 | Attending: Internal Medicine | Admitting: Internal Medicine

## 2014-01-15 ENCOUNTER — Inpatient Hospital Stay (HOSPITAL_COMMUNITY): Payer: 59

## 2014-01-15 DIAGNOSIS — J45909 Unspecified asthma, uncomplicated: Secondary | ICD-10-CM | POA: Diagnosis present

## 2014-01-15 DIAGNOSIS — N12 Tubulo-interstitial nephritis, not specified as acute or chronic: Secondary | ICD-10-CM | POA: Diagnosis present

## 2014-01-15 DIAGNOSIS — Z87891 Personal history of nicotine dependence: Secondary | ICD-10-CM

## 2014-01-15 DIAGNOSIS — A419 Sepsis, unspecified organism: Principal | ICD-10-CM | POA: Diagnosis present

## 2014-01-15 DIAGNOSIS — Z833 Family history of diabetes mellitus: Secondary | ICD-10-CM

## 2014-01-15 DIAGNOSIS — Z8249 Family history of ischemic heart disease and other diseases of the circulatory system: Secondary | ICD-10-CM

## 2014-01-15 DIAGNOSIS — Z6826 Body mass index (BMI) 26.0-26.9, adult: Secondary | ICD-10-CM

## 2014-01-15 DIAGNOSIS — G43909 Migraine, unspecified, not intractable, without status migrainosus: Secondary | ICD-10-CM | POA: Diagnosis present

## 2014-01-15 DIAGNOSIS — Z8041 Family history of malignant neoplasm of ovary: Secondary | ICD-10-CM

## 2014-01-15 LAB — URINALYSIS, ROUTINE W REFLEX MICROSCOPIC
Bilirubin Urine: NEGATIVE
Glucose, UA: NEGATIVE mg/dL
Ketones, ur: 40 mg/dL — AB
NITRITE: NEGATIVE
Protein, ur: 30 mg/dL — AB
SPECIFIC GRAVITY, URINE: 1.009 (ref 1.005–1.030)
UROBILINOGEN UA: 0.2 mg/dL (ref 0.0–1.0)
pH: 6.5 (ref 5.0–8.0)

## 2014-01-15 LAB — CBC WITH DIFFERENTIAL/PLATELET
BASOS ABS: 0.1 10*3/uL (ref 0.0–0.1)
Basophils Relative: 0 % (ref 0–1)
EOS PCT: 0 % (ref 0–5)
Eosinophils Absolute: 0 10*3/uL (ref 0.0–0.7)
HCT: 34.4 % — ABNORMAL LOW (ref 36.0–46.0)
Hemoglobin: 11.7 g/dL — ABNORMAL LOW (ref 12.0–15.0)
LYMPHS PCT: 5 % — AB (ref 12–46)
Lymphs Abs: 0.8 10*3/uL (ref 0.7–4.0)
MCH: 32.4 pg (ref 26.0–34.0)
MCHC: 34 g/dL (ref 30.0–36.0)
MCV: 95.3 fL (ref 78.0–100.0)
Monocytes Absolute: 1.4 10*3/uL — ABNORMAL HIGH (ref 0.1–1.0)
Monocytes Relative: 8 % (ref 3–12)
NEUTROS ABS: 15.5 10*3/uL — AB (ref 1.7–7.7)
Neutrophils Relative %: 87 % — ABNORMAL HIGH (ref 43–77)
Platelets: 228 10*3/uL (ref 150–400)
RBC: 3.61 MIL/uL — ABNORMAL LOW (ref 3.87–5.11)
RDW: 12.5 % (ref 11.5–15.5)
WBC: 17.8 10*3/uL — AB (ref 4.0–10.5)

## 2014-01-15 LAB — COMPREHENSIVE METABOLIC PANEL
ALBUMIN: 3.4 g/dL — AB (ref 3.5–5.2)
ALT: 15 U/L (ref 0–35)
AST: 19 U/L (ref 0–37)
Alkaline Phosphatase: 42 U/L (ref 39–117)
BUN: 10 mg/dL (ref 6–23)
CALCIUM: 8.9 mg/dL (ref 8.4–10.5)
CO2: 23 meq/L (ref 19–32)
Chloride: 99 mEq/L (ref 96–112)
Creatinine, Ser: 0.93 mg/dL (ref 0.50–1.10)
GFR calc Af Amer: >90 mL/min (ref >90)
GFR, EST NON AFRICAN AMERICAN: 82 mL/min — AB (ref >90)
Glucose, Bld: 131 mg/dL — ABNORMAL HIGH (ref 70–99)
Potassium: 3.8 mEq/L (ref 3.7–5.3)
SODIUM: 135 meq/L — AB (ref 137–147)
Total Bilirubin: 0.7 mg/dL (ref 0.3–1.2)
Total Protein: 7 g/dL (ref 6.0–8.3)

## 2014-01-15 LAB — URINE MICROSCOPIC-ADD ON

## 2014-01-15 LAB — I-STAT CG4 LACTIC ACID, ED: Lactic Acid, Venous: 0.85 mmol/L (ref 0.5–2.2)

## 2014-01-15 LAB — POC URINE PREG, ED: PREG TEST UR: NEGATIVE

## 2014-01-15 MED ORDER — HYDROMORPHONE HCL PF 1 MG/ML IJ SOLN
0.5000 mg | INTRAMUSCULAR | Status: DC | PRN
Start: 2014-01-15 — End: 2014-01-19
  Administered 2014-01-16 (×5): 0.5 mg via INTRAVENOUS
  Filled 2014-01-15 (×5): qty 1

## 2014-01-15 MED ORDER — ALBUTEROL SULFATE (2.5 MG/3ML) 0.083% IN NEBU: 2.5000 mg | INHALATION_SOLUTION | Freq: Four times a day (QID) | RESPIRATORY_TRACT | Status: DC | PRN

## 2014-01-15 MED ORDER — ALBUTEROL SULFATE HFA 108 (90 BASE) MCG/ACT IN AERS: 1.0000 | INHALATION_SPRAY | Freq: Four times a day (QID) | RESPIRATORY_TRACT | Status: DC | PRN

## 2014-01-15 MED ORDER — DEXTROSE 5 % IV SOLN
1.0000 g | INTRAVENOUS | Status: DC
  Administered 2014-01-15 – 2014-01-18 (×4): 1 g via INTRAVENOUS
  Filled 2014-01-15 (×4): qty 10

## 2014-01-15 MED ORDER — ACETAMINOPHEN 500 MG PO TABS
1000.0000 mg | ORAL_TABLET | Freq: Once | ORAL | Status: AC
  Administered 2014-01-15: 1000 mg via ORAL
  Filled 2014-01-15: qty 2

## 2014-01-15 MED ORDER — SODIUM CHLORIDE 0.9 % IV BOLUS (SEPSIS)
1000.0000 mL | Freq: Once | INTRAVENOUS | Status: AC
  Administered 2014-01-15: 1000 mL via INTRAVENOUS

## 2014-01-15 MED ORDER — MORPHINE SULFATE 4 MG/ML IJ SOLN
4.0000 mg | Freq: Once | INTRAMUSCULAR | Status: AC
  Administered 2014-01-15: 4 mg via INTRAVENOUS
  Filled 2014-01-15: qty 1

## 2014-01-15 MED ORDER — ONDANSETRON HCL 4 MG/2ML IJ SOLN
4.0000 mg | Freq: Once | INTRAMUSCULAR | Status: AC
  Administered 2014-01-15: 4 mg via INTRAVENOUS
  Filled 2014-01-15: qty 2

## 2014-01-15 MED ORDER — ONDANSETRON HCL 4 MG/2ML IJ SOLN: 4.0000 mg | Freq: Four times a day (QID) | INTRAMUSCULAR | Status: DC | PRN

## 2014-01-15 MED ORDER — ACETAMINOPHEN 325 MG PO TABS
650.0000 mg | ORAL_TABLET | Freq: Four times a day (QID) | ORAL | Status: DC | PRN
  Administered 2014-01-16: 325 mg via ORAL
  Administered 2014-01-16 – 2014-01-18 (×4): 650 mg via ORAL
  Filled 2014-01-15 (×5): qty 2

## 2014-01-15 MED ORDER — SENNOSIDES-DOCUSATE SODIUM 8.6-50 MG PO TABS: 1.0000 | ORAL_TABLET | Freq: Every evening | ORAL | Status: DC | PRN

## 2014-01-15 MED ORDER — ACETAMINOPHEN 650 MG RE SUPP: 650.0000 mg | Freq: Four times a day (QID) | RECTAL | Status: DC | PRN

## 2014-01-15 MED ORDER — KETOROLAC TROMETHAMINE 30 MG/ML IJ SOLN
30.0000 mg | Freq: Four times a day (QID) | INTRAMUSCULAR | Status: DC | PRN
  Administered 2014-01-17 (×2): 30 mg via INTRAVENOUS
  Filled 2014-01-15: qty 2
  Filled 2014-01-15: qty 1
  Filled 2014-01-15: qty 2

## 2014-01-15 MED ORDER — IBUPROFEN 800 MG PO TABS
800.0000 mg | ORAL_TABLET | Freq: Once | ORAL | Status: AC
  Administered 2014-01-15: 800 mg via ORAL
  Filled 2014-01-15: qty 1

## 2014-01-15 MED ORDER — MORPHINE SULFATE 4 MG/ML IJ SOLN
4.0000 mg | Freq: Once | INTRAMUSCULAR | Status: AC
Start: 2014-01-15 — End: 2014-01-15
  Administered 2014-01-15: 4 mg via INTRAVENOUS
  Filled 2014-01-15: qty 1

## 2014-01-15 MED ORDER — POTASSIUM CHLORIDE IN NACL 20-0.9 MEQ/L-% IV SOLN
INTRAVENOUS | Status: DC
  Administered 2014-01-16 (×2): via INTRAVENOUS
  Filled 2014-01-15 (×4): qty 1000

## 2014-01-15 MED ORDER — ONDANSETRON HCL 4 MG PO TABS: 4.0000 mg | ORAL_TABLET | Freq: Four times a day (QID) | ORAL | Status: DC | PRN

## 2014-01-15 MED ORDER — ENOXAPARIN SODIUM 40 MG/0.4ML ~~LOC~~ SOLN
40.0000 mg | Freq: Every day | SUBCUTANEOUS | Status: DC
  Administered 2014-01-16 – 2014-01-18 (×4): 40 mg via SUBCUTANEOUS
  Filled 2014-01-15 (×6): qty 0.4

## 2014-01-15 NOTE — ED Notes (Signed)
Patient transported to CT 

## 2014-01-15 NOTE — ED Notes (Signed)
Explained to pt we needed a sample, she said she couldn't at this time. Asked her to notify when she was able to void

## 2014-01-15 NOTE — ED Notes (Signed)
Pt states that she had an odor and blood in her urine before she took the at home UTI test which showed she had an UTI.  Pt was taking OTC meds for the UTI but still feeling bad. Pt states that she vomited 5 times today and has a fever.

## 2014-01-15 NOTE — ED Notes (Signed)
Pt stated she needed to use restroom. Pt said she couldn't ambulate and wanted the bedpan. Pt was unable to go. Was told to notify when she was able to try again.

## 2014-01-15 NOTE — ED Provider Notes (Signed)
CSN: [id number]     Arrival date & time 01/15/14  1831 History   First MD Initiated Contact with Patient 01/15/14 1853     Chief Complaint  Patient presents with  . Hematuria  . Back Pain     (Consider location/radiation/quality/duration/timing/severity/associated sxs/prior Treatment) HPI Comments: Patient is a 28 year old female with history of chlamydia and asthma who presents today with low back pain and urinary symptoms. She states her urinary symptoms began Sunday and have gradually worsened. She has frequency dysuria, urgency. She feels generally weak he feels as though she cannot walk. She had associated fever and chills. She has nausea and vomiting. No history of prior kidney stone.  Patient is a 28 y.o. female presenting with hematuria and back pain. The history is provided by the patient. No language interpreter was used.  Hematuria Associated symptoms include abdominal pain, chills, a fever, nausea, vomiting and weakness.  Back Pain Associated symptoms: abdominal pain, dysuria, fever and weakness     Past Medical History  Diagnosis Date  . Chlamydia   . Asthma    Past Surgical History  Procedure Laterality Date  . Induced abortion      x 2   Family History  Problem Relation Age of Onset  . Hypertension    . Congestive Heart Failure    . Diabetes    . Ovarian cancer Maternal Grandmother   . Cancer Maternal Grandmother   . Diabetes Mother   . Hypertension Mother   . Hyperlipidemia Father   . Diabetes Father    History  Substance Use Topics  . Smoking status: Former Smoker    Quit date: 06/11/2013  . Smokeless tobacco: Never Used     Comment: "Hookah" tobacco    . Alcohol Use: Yes     Comment: "socially"   OB History   Grav Para Term Preterm Abortions TAB SAB Ect Mult Living   1              Review of Systems  Constitutional: Positive for fever and chills.  Gastrointestinal: Positive for nausea, vomiting and abdominal pain.  Genitourinary: Positive  for dysuria, urgency, frequency, hematuria and flank pain.  Musculoskeletal: Positive for back pain.  Neurological: Positive for weakness.  All other systems reviewed and are negative.     Allergies  Review of patient's allergies indicates no known allergies.  Home Medications   Prior to Admission medications   Medication Sig Start Date End Date Taking? Authorizing Provider  albuterol (PROVENTIL HFA;VENTOLIN HFA) 108 (90 BASE) MCG/ACT inhaler Inhale 1-2 puffs into the lungs every 6 (six) hours as needed for wheezing or shortness of breath. 07/14/13   Karie Schwalbe, MD  amoxicillin (AMOXIL) 500 MG tablet Take 2 tablets (1,000 mg total) by mouth 2 (two) times daily. 07/14/13   Karie Schwalbe, MD  meloxicam (MOBIC) 7.5 MG tablet Take 1 tablet (7.5 mg total) by mouth 2 (two) times daily after a meal. 05/30/13   Linna Hoff, MD  prenatal vitamin w/FE, FA (PRENATAL 1 + 1) 27-1 MG TABS Take 1 tablet by mouth daily.  09/18/12   Historical Provider, MD   BP 105/55  Pulse 89  Temp(Src) 99 F (37.2 C) (Oral)  Resp 18  SpO2 98%  LMP 01/09/2014 Physical Exam  Nursing note and vitals reviewed. Constitutional: She is oriented to person, place, and time. She appears well-developed and well-nourished. She appears ill. No distress.  HENT:  Head: Normocephalic and atraumatic.  Right Ear:  External ear normal.  Left Ear: External ear normal.  Nose: Nose normal.  Mouth/Throat: Oropharynx is clear and moist.  Eyes: Conjunctivae are normal.  Neck: Normal range of motion.  No nuchal rigidity or meningeal signs  Cardiovascular: Regular rhythm and normal heart sounds.  Tachycardia present.   Pulmonary/Chest: Effort normal and breath sounds normal. No stridor. No respiratory distress. She has no wheezes. She has no rales.  Abdominal: Soft. She exhibits no distension. There is generalized tenderness. There is CVA tenderness. There is no rigidity and no guarding.  Musculoskeletal: Normal range of  motion.  Neurological: She is alert and oriented to person, place, and time. She has normal strength.  Skin: Skin is warm and dry. She is not diaphoretic. No erythema.  Psychiatric: She has a normal mood and affect. Her behavior is normal.    ED Course  Procedures (including critical care time) Labs Review Labs Reviewed  URINALYSIS, ROUTINE W REFLEX MICROSCOPIC - Abnormal; Notable for the following:    APPearance CLOUDY (*)    Hgb urine dipstick MODERATE (*)    Ketones, ur 40 (*)    Protein, ur 30 (*)    Leukocytes, UA LARGE (*)    All other components within normal limits  CBC WITH DIFFERENTIAL - Abnormal; Notable for the following:    WBC 17.8 (*)    RBC 3.61 (*)    Hemoglobin 11.7 (*)    HCT 34.4 (*)    Neutrophils Relative % 87 (*)    Neutro Abs 15.5 (*)    Lymphocytes Relative 5 (*)    Monocytes Absolute 1.4 (*)    All other components within normal limits  COMPREHENSIVE METABOLIC PANEL - Abnormal; Notable for the following:    Sodium 135 (*)    Glucose, Bld 131 (*)    Albumin 3.4 (*)    GFR calc non Af Amer 82 (*)    All other components within normal limits  URINE MICROSCOPIC-ADD ON - Abnormal; Notable for the following:    Squamous Epithelial / LPF FEW (*)    Bacteria, UA FEW (*)    All other components within normal limits  URINE CULTURE  POC URINE PREG, ED  I-STAT CG4 LACTIC ACID, ED    Imaging Review Ct Abdomen Pelvis Wo Contrast  01/15/2014   CLINICAL DATA:  Right-sided flank pain.  Renal calculus.  EXAM: CT ABDOMEN AND PELVIS WITHOUT CONTRAST  TECHNIQUE: Multidetector CT imaging of the abdomen and pelvis was performed following the standard protocol without IV contrast.  COMPARISON:  None.  FINDINGS: Bones:  No aggressive osseous lesions.  Lung Bases: Dependent atelectasis. Tiny bilateral pleural effusions.  Liver: Unenhanced CT was performed per clinician order. Lack of IV contrast limits sensitivity and specificity, especially for evaluation of  abdominal/pelvic solid viscera. Grossly normal.  Spleen:  Normal.  Gallbladder:  Distended.  No calcified stones.  Common bile duct:  Normal.  Pancreas:  Normal.  Adrenal glands:  Normal bilaterally.  Kidneys: The left kidney appears normal. Left ureter is also normal. There are inflammatory changes around the right kidney. No right renal calculi. Right periureteric inflammatory changes are present. There is no distal right ureteral stone identified. No stone is identified in the urinary bladder  Stomach:  Grossly normal.  Small bowel:  Grossly normal.  Colon: Large stool burden. Mobile cecum is present located anteriorly in the anatomic pelvis. The appendix is not identified.  Pelvic Genitourinary: Poor visualization of the adnexal structures in the absence of oral and  IV contrast. Urinary bladder grossly appears within normal limits.  Vasculature: Distended inferior vena cava, likely secondary to resuscitation. This may also account for the pleural effusions.  Body Wall: Fat containing tiny periumbilical hernia.  IMPRESSION: 1. No renal calculi identified. Inflammatory changes of the right kidney and ureter may relate to recent passage of stone or ascending urinary tract infection. 2. Tiny bilateral pleural effusions likely related to high volume status.   Electronically Signed   By: Andreas NewportGeoffrey  Lamke M.D.   On: 01/15/2014 23:20     EKG Interpretation None      MDM   Final diagnoses:  Pyelonephritis   Patient presents to the emergency department where pyelonephritis. She is febrile and tachycardic which responds well to antipyretics and fluid. Elevated white blood cell count at 17.8. Will start patient on rocephin and admit to medicine for further management. Admission is appreciated. Patient is hemodynamically stable. Discussed case with Dr. Fayrene FearingJames who agrees with plan. Patient / Family / Caregiver informed of clinical course, understand medical decision-making process, and agree with plan.   Mora BellmanHannah  S Merrell, PA-C 01/15/14 [id number]

## 2014-01-15 NOTE — H&P (Signed)
[facility] History and Physical  Roberta Farley ZOX:[id number] DOB: [birth year] DOA: 01/15/2014  Referring physician: EDP PCP: Piedad ClimesMartin, William Cody, PA-C   Chief Complaint: blood in urine, vomiting, back pain  HPI: Roberta Farley is a 28 y.o. female  Presents to ED with several day h/o fevers, chills, vomiting, right flank and lower abdominal pain. She's also had vague dysuria and noticed hematuria today. She feels weak. In the emergency room, her temperature was 103. Urinalysis shows blood and leukocyte esterase with too numerous to count white cells. White blood cell count is 17,000. The pain is crampy in nature.  Review of Systems:  Systems review. As above otherwise negative.  Past Medical History  Diagnosis Date  . Chlamydia   . Asthma    Past Surgical History  Procedure Laterality Date  . Induced abortion      x 2   Social History:  reports that she quit smoking about 7 months ago. She has never used smokeless tobacco. She reports that she drinks alcohol. She reports that she does not use illicit drugs. 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 Hypertension    . Congestive Heart Failure    . Diabetes    . Ovarian cancer Maternal Grandmother   . Cancer Maternal Grandmother   . Diabetes Mother   . Hypertension Mother   . Hyperlipidemia Father   . Diabetes Father      Prior to Admission medications   Medication Sig Start Date End Date Taking? Authorizing Provider  albuterol (PROVENTIL HFA;VENTOLIN HFA) 108 (90 BASE) MCG/ACT inhaler Inhale 1-2 puffs into the lungs every 6 (six) hours as needed for wheezing or shortness of breath. 07/14/13  Yes Karie Schwalbeichard I Letvak, MD  CRANBERRY PO Take 1 capsule by mouth daily.   Yes Historical Provider, MD  ibuprofen (ADVIL,MOTRIN) 200 MG tablet Take 600 mg by mouth every 6 (six) hours as needed (pain).   Yes Historical Provider, MD    Physical Exam: Filed Vitals:   01/15/14 2200  BP: 108/58  Pulse: 86  Temp:   Resp:     BP 108/58  Pulse 86  Temp(Src) 99 F (37.2 C) (Oral)  Resp 18  SpO2 93%  LMP 01/09/2014 BP 108/58  Pulse 86  Temp(Src) 99 F (37.2 C) (Oral)  Resp 18  SpO2 93%  LMP 01/09/2014  General Appearance:    Alert, uncomfortable AA female. oriented  Head:    Normocephalic, without obvious abnormality, atraumatic  Eyes:    PERRL, conjunctiva/corneas clear, EOM's intact, fundi    benign, both eyes     Nose:   Nares normal, septum midline, mucosa normal, no drainage    or sinus tenderness  Throat:   Dry mucous membranes. OP without erythema or exudate  Neck:   Supple, symmetrical, trachea midline, no adenopathy;    thyroid:  no enlargement/tenderness/nodules; no carotid   bruit or JVD  Back:     Symmetric, no curvature, mild right CVA tenderness  Lungs:     Clear to auscultation bilaterally, respirations unlabored  Chest Wall:    No tenderness or deformity   Heart:    Regular rate and rhythm, S1 and S2 normal, no murmur, rub   or gallop     Abdomen:     Soft, lower abdominal and suprapubic tenderness. Nondistended. Normal bowel sounds  Genitalia:   deferred  Rectal:    deferred  Extremities:   Extremities normal, atraumatic, no cyanosis or edema  Pulses:   2+ and symmetric all extremities  Skin:   Skin color, texture, turgor normal, no rashes or lesions  Lymph nodes:   Cervical, supraclavicular, and axillary nodes normal  Neurologic:   CNII-XII intact, normal strength, sensation and reflexes    throughout           Psych: normal affect  Labs on Admission:  Basic Metabolic Panel:  Recent Labs Lab 01/15/14 1908  NA 135*  K 3.8  CL 99  CO2 23  GLUCOSE 131*  BUN 10  CREATININE 0.93  CALCIUM 8.9   Liver Function Tests:  Recent Labs Lab 01/15/14 1908  AST 19  ALT 15  ALKPHOS 42  BILITOT 0.7  PROT 7.0  ALBUMIN 3.4*   No results found for this basename: LIPASE,  AMYLASE,  in the last 168 hours No results found for this basename: AMMONIA,  in the last 168 hours CBC:  Recent Labs Lab 01/15/14 1908  WBC 17.8*  NEUTROABS 15.5*  HGB 11.7*  HCT 34.4*  MCV 95.3  PLT 228   Urinalysis    Component Value Date/Time   COLORURINE YELLOW 01/15/2014 2115   APPEARANCEUR CLOUDY* 01/15/2014 2115   LABSPEC 1.009 01/15/2014 2115   PHURINE 6.5 01/15/2014 2115   GLUCOSEU NEGATIVE 01/15/2014 2115   HGBUR MODERATE* 01/15/2014 2115   BILIRUBINUR NEGATIVE 01/15/2014 2115   BILIRUBINUR NEGATIVE 01/25/2013 1833   KETONESUR 40* 01/15/2014 2115   PROTEINUR 30* 01/15/2014 2115   PROTEINUR TRACE 01/25/2013 1833   UROBILINOGEN 0.2 01/15/2014 2115   UROBILINOGEN negative 01/25/2013 1833   NITRITE NEGATIVE 01/15/2014 2115   NITRITE NEGATIVE 01/25/2013 1833   LEUKOCYTESUR LARGE* 01/15/2014 2115   Assessment/Plan Principal Problem:   Pyelonephritis with Nausea vomiting and hematuria Active Problems:   ASTHMA, INTERMITTENT, MILD  Will admit, IVF, clear liquids, nausea and pain meds. Check CT abd/pelvis r/o stones  Code Status: full Family Communication: significant other at bedside Disposition Plan: home  Time spent: 50 min  SULLIVAN,CORINNA L [facility] Pager [phone]

## 2014-01-16 DIAGNOSIS — A419 Sepsis, unspecified organism: Secondary | ICD-10-CM | POA: Diagnosis present

## 2014-01-16 MED ORDER — HYDROCODONE-ACETAMINOPHEN 5-325 MG PO TABS
1.0000 | ORAL_TABLET | ORAL | Status: DC | PRN
  Administered 2014-01-16 – 2014-01-19 (×8): 1 via ORAL
  Filled 2014-01-16 (×8): qty 1

## 2014-01-16 MED ORDER — SODIUM CHLORIDE 0.9 % IV SOLN
INTRAVENOUS | Status: DC
Start: 2014-01-16 — End: 2014-01-19
  Administered 2014-01-16: 14:00:00 via INTRAVENOUS

## 2014-01-16 NOTE — Progress Notes (Signed)
PROGRESS NOTE  Rosaland LaoLatasha N Bradford AVW:[id number] DOB: 03/20/1986 DOA: 01/15/2014 PCP: Piedad ClimesMartin, William Cody, PA-C  HPI: Presents to ED with several day h/o fevers, chills, vomiting, right flank and lower abdominal pain. She's also had vague dysuria and noticed hematuria today. She feels weak. In the emergency room, her temperature was 103. Urinalysis shows blood and leukocyte esterase with too numerous to count white cells. White blood cell count is 17,000. The pain is crampy in nature.  Assessment/Plan: Sepsis due to pyelonephritis, present on admission - continue IV Abx, urine culture pending - afebrile this morning, clinically improving.   Asthma, intermittent, mild - stable   Diet: clear liquid Fluids: NS 125 cc/h DVT Prophylaxis: Lovenox  Code Status: Full Family Communication: d/w patient  Disposition Plan: inpatient, home when ready   Consultants:  None   Procedures:  None    Antibiotics Ceftriaxone 6/17 >>  HPI/Subjective: Still with flank pain this morning  Objective: Filed Vitals:   01/15/14 2200 01/15/14 2317 01/16/14 0035 01/16/14 0624  BP: 108/58 105/55 98/60 96/49   Pulse: 86 89 92 79  Temp:  99 F (37.2 C) 98.5 F (36.9 C) 97.7 F (36.5 C)  TempSrc:  Oral Oral Oral  Resp:  18 20 20   Height:   5' 7.5" (1.715 m)   Weight:   77.9 kg (171 lb 11.8 oz)   SpO2: 93% 98% 98% 100%    Intake/Output Summary (Last 24 hours) at 01/16/14 0935 Last data filed at 01/16/14 [id number]  Gross per 24 hour  Intake   2120 ml  Output    600 ml  Net   1520 ml   Filed Weights   01/16/14 0035  Weight: 77.9 kg (171 lb 11.8 oz)   Exam:  General:  NAD  Cardiovascular: regular rate and rhythm, without MRG  Respiratory: good air movement, clear to auscultation throughout, no wheezing, ronchi or rales  Abdomen: soft, positive bowel sounds  MSK: no peripheral edema  Neuro: non focal  Data Reviewed: Basic Metabolic Panel:  Recent Labs Lab 01/15/14 1908  NA  135*  K 3.8  CL 99  CO2 23  GLUCOSE 131*  BUN 10  CREATININE 0.93  CALCIUM 8.9   Liver Function Tests:  Recent Labs Lab 01/15/14 1908  AST 19  ALT 15  ALKPHOS 42  BILITOT 0.7  PROT 7.0  ALBUMIN 3.4*   CBC:  Recent Labs Lab 01/15/14 1908  WBC 17.8*  NEUTROABS 15.5*  HGB 11.7*  HCT 34.4*  MCV 95.3  PLT 228   Studies: Ct Abdomen Pelvis Wo Contrast  01/15/2014   CLINICAL DATA:  Right-sided flank pain.  Renal calculus.  EXAM: CT ABDOMEN AND PELVIS WITHOUT CONTRAST  TECHNIQUE: Multidetector CT imaging of the abdomen and pelvis was performed following the standard protocol without IV contrast.  COMPARISON:  None.  FINDINGS: Bones:  No aggressive osseous lesions.  Lung Bases: Dependent atelectasis. Tiny bilateral pleural effusions.  Liver: Unenhanced CT was performed per clinician order. Lack of IV contrast limits sensitivity and specificity, especially for evaluation of abdominal/pelvic solid viscera. Grossly normal.  Spleen:  Normal.  Gallbladder:  Distended.  No calcified stones.  Common bile duct:  Normal.  Pancreas:  Normal.  Adrenal glands:  Normal bilaterally.  Kidneys: The left kidney appears normal. Left ureter is also normal. There are inflammatory changes around the right kidney. No right renal calculi. Right periureteric inflammatory changes are present. There is no distal right ureteral stone identified. No stone is identified in  the urinary bladder  Stomach:  Grossly normal.  Small bowel:  Grossly normal.  Colon: Large stool burden. Mobile cecum is present located anteriorly in the anatomic pelvis. The appendix is not identified.  Pelvic Genitourinary: Poor visualization of the adnexal structures in the absence of oral and IV contrast. Urinary bladder grossly appears within normal limits.  Vasculature: Distended inferior vena cava, likely secondary to resuscitation. This may also account for the pleural effusions.  Body Wall: Fat containing tiny periumbilical hernia.   IMPRESSION: 1. No renal calculi identified. Inflammatory changes of the right kidney and ureter may relate to recent passage of stone or ascending urinary tract infection. 2. Tiny bilateral pleural effusions likely related to high volume status.   Electronically Signed   By: Andreas NewportGeoffrey  Lamke M.D.   On: 01/15/2014 23:20    Scheduled Meds: . cefTRIAXone (ROCEPHIN)  IV  1 g Intravenous Q24H  . enoxaparin (LOVENOX) injection  40 mg Subcutaneous QHS   Continuous Infusions: . 0.9 % NaCl with KCl 20 mEq / L 125 mL/hr at 01/16/14 [id number]    Principal Problem:   Sepsis Active Problems:   ASTHMA, INTERMITTENT, MILD   Pyelonephritis   Time spent: 25  This note has been created with Education officer, environmentalDragon speech recognition software and smart phrase technology. Any transcriptional errors are unintentional.   Pamella Pertostin Nabeeha Badertscher, MD [facility] Pager [phone]. If 7 PM - 7 AM, please contact night-coverage at [email or web address], password [email or web address] 01/16/2014, 9:35 AM  LOS: 1 day

## 2014-01-16 NOTE — Progress Notes (Signed)
Nutrition Brief Note  Patient identified on the Malnutrition Screening Tool (MST) Report  Wt Readings from Last 15 Encounters:  01/16/14 171 lb 11.8 oz (77.9 kg)  07/14/13 157 lb (71.215 kg)  06/11/13 160 lb (72.576 kg)  01/25/13 146 lb 12.8 oz (66.588 kg)  01/15/13 148 lb (67.132 kg)  12/18/12 149 lb (67.586 kg)  11/29/12 148 lb (67.132 kg)  01/15/09 153 lb (69.4 kg)  11/26/08 155 lb (70.308 kg)  11/21/08 152 lb (68.947 kg)  11/20/08 156 lb (70.761 kg)  09/08/07 158 lb (71.668 kg)  08/28/07 156 lb 8 oz (70.988 kg)    Body mass index is 26.49 kg/(m^2). Patient meets criteria for Overweight based on current BMI.   Current diet order is Clear liquid, patient is consuming approximately >75% of meals at this time. Labs and medications reviewed.   Pt reported one day decreased intake appetite d/t nausea/vomiting; was tolerating bland foods such as toast. Nausea has improved during admit and has been able to tolerate clear liquid diet w/out nausea/vomiting/stomach pains. Denied any unintentional wt loss  No nutrition interventions warranted at this time. If nutrition issues arise, please consult RD.   Lloyd HugerSarah F Beaty MS RD LDN Clinical Dietitian [phone]

## 2014-01-16 NOTE — Progress Notes (Signed)
UR Completed Brenda Graves-Bigelow, RN,BSN [phone]  

## 2014-01-17 LAB — URINE CULTURE

## 2014-01-17 LAB — CBC
HCT: 31.2 % — ABNORMAL LOW (ref 36.0–46.0)
Hemoglobin: 10.5 g/dL — ABNORMAL LOW (ref 12.0–15.0)
MCH: 32.8 pg (ref 26.0–34.0)
MCHC: 33.7 g/dL (ref 30.0–36.0)
MCV: 97.5 fL (ref 78.0–100.0)
PLATELETS: 180 10*3/uL (ref 150–400)
RBC: 3.2 MIL/uL — ABNORMAL LOW (ref 3.87–5.11)
RDW: 12.7 % (ref 11.5–15.5)
WBC: 12.9 10*3/uL — ABNORMAL HIGH (ref 4.0–10.5)

## 2014-01-17 LAB — BASIC METABOLIC PANEL
BUN: 6 mg/dL (ref 6–23)
CALCIUM: 8 mg/dL — AB (ref 8.4–10.5)
CO2: 19 mEq/L (ref 19–32)
Chloride: 104 mEq/L (ref 96–112)
Creatinine, Ser: 0.65 mg/dL (ref 0.50–1.10)
Glucose, Bld: 92 mg/dL (ref 70–99)
POTASSIUM: 4.3 meq/L (ref 3.7–5.3)
SODIUM: 136 meq/L — AB (ref 137–147)

## 2014-01-17 MED ORDER — PROCHLORPERAZINE EDISYLATE 5 MG/ML IJ SOLN
10.0000 mg | Freq: Four times a day (QID) | INTRAMUSCULAR | Status: DC | PRN
Start: 2014-01-17 — End: 2014-01-19
  Administered 2014-01-17 – 2014-01-18 (×3): 10 mg via INTRAVENOUS
  Filled 2014-01-17 (×3): qty 2

## 2014-01-17 MED ORDER — KETOROLAC TROMETHAMINE 15 MG/ML IJ SOLN: 30.0000 mg | Freq: Four times a day (QID) | INTRAMUSCULAR | Status: DC | PRN

## 2014-01-17 NOTE — Progress Notes (Addendum)
PROGRESS NOTE  Roberta Farley ZOX:[id number] DOB: 1986/05/22 DOA: 01/15/2014 PCP: Piedad ClimesMartin, William Cody, PA-C  HPI: Presents to ED with several day h/o fevers, chills, vomiting, right flank and lower abdominal pain. She's also had vague dysuria and noticed hematuria today. She feels weak. In the emergency room, her temperature was 103. Urinalysis shows blood and leukocyte esterase with too numerous to count white cells. White blood cell count is 17,000. The pain is crampy in nature.  Assessment/Plan: Sepsis due to pyelonephritis, present on admission - continue IV Abx, urine cultures without growth, - afebrile this morning, clinically improving, will continue antibiotics and she will need 14 day total course  Asthma, intermittent, mild - stable   Diet: clear liquid Fluids: NS 125 cc/h DVT Prophylaxis: Lovenox  Code Status: Full Family Communication: d/w patient  Disposition Plan: inpatient, home when ready   Consultants:  None   Procedures:  None    Antibiotics Ceftriaxone 6/17 >>  HPI/Subjective: Still with flank pain this morning, still ongoing burning with urination, has a headache today  Objective: Filed Vitals:   01/16/14 2346 01/17/14 0252 01/17/14 0557 01/17/14 0950  BP:  106/74 104/66 110/68  Pulse:  74 75 72  Temp: 98.4 F (36.9 C) 98.6 F (37 C) 98 F (36.7 C) 98.3 F (36.8 C)  TempSrc: Oral Oral Oral Oral  Resp:    17  Height:      Weight:      SpO2:  99% 100% 99%    Intake/Output Summary (Last 24 hours) at 01/17/14 1401 Last data filed at 01/17/14 1258  Gross per 24 hour  Intake    847 ml  Output   1350 ml  Net   -503 ml   Filed Weights   01/16/14 0035  Weight: 77.9 kg (171 lb 11.8 oz)   Exam:  General:  NAD  Cardiovascular: regular rate and rhythm, without MRG  Respiratory: good air movement, clear to auscultation throughout, no wheezing, ronchi or rales  Abdomen: soft, positive bowel sounds  MSK: no peripheral  edema  Neuro: non focal  Data Reviewed: Basic Metabolic Panel:  Recent Labs Lab 01/15/14 1908 01/17/14 0340  NA 135* 136*  K 3.8 4.3  CL 99 104  CO2 23 19  GLUCOSE 131* 92  BUN 10 6  CREATININE 0.93 0.65  CALCIUM 8.9 8.0*   Liver Function Tests:  Recent Labs Lab 01/15/14 1908  AST 19  ALT 15  ALKPHOS 42  BILITOT 0.7  PROT 7.0  ALBUMIN 3.4*   CBC:  Recent Labs Lab 01/15/14 1908 01/17/14 0340  WBC 17.8* 12.9*  NEUTROABS 15.5*  --   HGB 11.7* 10.5*  HCT 34.4* 31.2*  MCV 95.3 97.5  PLT 228 180   Studies: Ct Abdomen Pelvis Wo Contrast  01/15/2014   CLINICAL DATA:  Right-sided flank pain.  Renal calculus.  EXAM: CT ABDOMEN AND PELVIS WITHOUT CONTRAST  TECHNIQUE: Multidetector CT imaging of the abdomen and pelvis was performed following the standard protocol without IV contrast.  COMPARISON:  None.  FINDINGS: Bones:  No aggressive osseous lesions.  Lung Bases: Dependent atelectasis. Tiny bilateral pleural effusions.  Liver: Unenhanced CT was performed per clinician order. Lack of IV contrast limits sensitivity and specificity, especially for evaluation of abdominal/pelvic solid viscera. Grossly normal.  Spleen:  Normal.  Gallbladder:  Distended.  No calcified stones.  Common bile duct:  Normal.  Pancreas:  Normal.  Adrenal glands:  Normal bilaterally.  Kidneys: The left kidney appears normal. Left  ureter is also normal. There are inflammatory changes around the right kidney. No right renal calculi. Right periureteric inflammatory changes are present. There is no distal right ureteral stone identified. No stone is identified in the urinary bladder  Stomach:  Grossly normal.  Small bowel:  Grossly normal.  Colon: Large stool burden. Mobile cecum is present located anteriorly in the anatomic pelvis. The appendix is not identified.  Pelvic Genitourinary: Poor visualization of the adnexal structures in the absence of oral and IV contrast. Urinary bladder grossly appears within  normal limits.  Vasculature: Distended inferior vena cava, likely secondary to resuscitation. This may also account for the pleural effusions.  Body Wall: Fat containing tiny periumbilical hernia.  IMPRESSION: 1. No renal calculi identified. Inflammatory changes of the right kidney and ureter may relate to recent passage of stone or ascending urinary tract infection. 2. Tiny bilateral pleural effusions likely related to high volume status.   Electronically Signed   By: Andreas NewportGeoffrey  Lamke M.D.   On: 01/15/2014 23:20    Scheduled Meds: . cefTRIAXone (ROCEPHIN)  IV  1 g Intravenous Q24H  . enoxaparin (LOVENOX) injection  40 mg Subcutaneous QHS   Continuous Infusions: . sodium chloride 75 mL/hr at 01/16/14 1402    Principal Problem:   Sepsis Active Problems:   ASTHMA, INTERMITTENT, MILD   Pyelonephritis   Time spent: 25  This note has been created with Education officer, environmentalDragon speech recognition software and smart phrase technology. Any transcriptional errors are unintentional.   Pamella Pertostin Medea Deines, MD [facility] Pager [phone]. If 7 PM - 7 AM, please contact night-coverage at [email or web address], password [email or web address] 01/17/2014, 2:01 PM  LOS: 2 days

## 2014-01-18 ENCOUNTER — Inpatient Hospital Stay (HOSPITAL_COMMUNITY): Payer: 59

## 2014-01-18 LAB — COMPREHENSIVE METABOLIC PANEL
ALK PHOS: 45 U/L (ref 39–117)
ALT: 21 U/L (ref 0–35)
AST: 26 U/L (ref 0–37)
Albumin: 2.5 g/dL — ABNORMAL LOW (ref 3.5–5.2)
BUN: 6 mg/dL (ref 6–23)
CALCIUM: 8.3 mg/dL — AB (ref 8.4–10.5)
CO2: 21 meq/L (ref 19–32)
Chloride: 102 mEq/L (ref 96–112)
Creatinine, Ser: 0.64 mg/dL (ref 0.50–1.10)
GLUCOSE: 100 mg/dL — AB (ref 70–99)
POTASSIUM: 3.8 meq/L (ref 3.7–5.3)
Sodium: 135 mEq/L — ABNORMAL LOW (ref 137–147)
Total Bilirubin: 0.3 mg/dL (ref 0.3–1.2)
Total Protein: 6 g/dL (ref 6.0–8.3)

## 2014-01-18 LAB — CBC
HCT: 31 % — ABNORMAL LOW (ref 36.0–46.0)
HEMOGLOBIN: 10.7 g/dL — AB (ref 12.0–15.0)
MCH: 32.7 pg (ref 26.0–34.0)
MCHC: 34.5 g/dL (ref 30.0–36.0)
MCV: 94.8 fL (ref 78.0–100.0)
PLATELETS: 214 10*3/uL (ref 150–400)
RBC: 3.27 MIL/uL — AB (ref 3.87–5.11)
RDW: 12.2 % (ref 11.5–15.5)
WBC: 8.6 10*3/uL (ref 4.0–10.5)

## 2014-01-18 LAB — URINALYSIS, ROUTINE W REFLEX MICROSCOPIC
Bilirubin Urine: NEGATIVE
GLUCOSE, UA: NEGATIVE mg/dL
Hgb urine dipstick: NEGATIVE
Ketones, ur: 40 mg/dL — AB
Nitrite: NEGATIVE
PROTEIN: NEGATIVE mg/dL
SPECIFIC GRAVITY, URINE: 1.006 (ref 1.005–1.030)
UROBILINOGEN UA: 0.2 mg/dL (ref 0.0–1.0)
pH: 6.5 (ref 5.0–8.0)

## 2014-01-18 LAB — HIV ANTIBODY (ROUTINE TESTING W REFLEX): HIV 1&2 Ab, 4th Generation: NONREACTIVE

## 2014-01-18 LAB — URINE MICROSCOPIC-ADD ON

## 2014-01-18 MED ORDER — BUTALBITAL-APAP-CAFFEINE 50-325-40 MG PO TABS: 1.0000 | ORAL_TABLET | ORAL | Status: DC | PRN

## 2014-01-18 MED ORDER — ASPIRIN-ACETAMINOPHEN-CAFFEINE 250-250-65 MG PO TABS
1.0000 | ORAL_TABLET | Freq: Four times a day (QID) | ORAL | Status: DC | PRN
  Administered 2014-01-18 – 2014-01-19 (×2): 1 via ORAL
  Filled 2014-01-18 (×2): qty 1

## 2014-01-18 NOTE — Progress Notes (Signed)
PROGRESS NOTE  Roberta Farley [id number] DOB: [birth year] DOA: 01/15/2014 PCP: Piedad ClimesMartin, William Cody, PA-C  HPI: Presents to ED with several day h/o fevers, chills, vomiting, right flank and lower abdominal pain. She's also had vague dysuria and noticed hematuria today. She feels weak. In the emergency room, her temperature was 103. Urinalysis shows blood and leukocyte esterase with too numerous to count white cells. White blood cell count is 17,000. The pain is crampy in nature.  Assessment/Plan: Sepsis due to pyelonephritis, present on admission - continue IV Abx, urine cultures without growth, repeat UA and cultures today.  - still with fever, repeat renal US  - clinically improving  Asthma, intermittent, mild - stable   Migraines - she does have migraines at home, relieved by Excedrin, restart that here.   Diet: clear liquid Fluids: NS 125 cc/h DVT Prophylaxis: Lovenox  Code Status: Full Family Communication: d/w patient  Disposition Plan: inpatient, home when ready   Consultants:  None   Procedures:  None    Antibiotics Ceftriaxone 6/17 >>  HPI/Subjective: Flank pain and dysuria improving, feels better.   Objective: Filed Vitals:   01/17/14 1855 01/17/14 2123 01/18/14 0447 01/18/14 0535  BP:  132/76 121/71   Pulse:  58 82   Temp: 102.7 F (39.3 C) 98.2 F (36.8 C) 101.1 F (38.4 C) 99.2 F (37.3 C)  TempSrc: Oral  Oral Oral  Resp:   20   Height:      Weight:      SpO2:  99% 100%     Intake/Output Summary (Last 24 hours) at 01/18/14 1013 Last data filed at 01/18/14 0841  Gross per 24 hour  Intake      0 ml  Output   2950 ml  Net  -2950 ml   Filed Weights   01/16/14 0035  Weight: 77.9 kg (171 lb 11.8 oz)   Exam:  General:  NAD  Cardiovascular: regular rate and rhythm, without MRG  Respiratory: good air movement, clear to auscultation throughout, no wheezing, ronchi or rales  Abdomen: soft, positive bowel sounds  MSK: no  peripheral edema, right CVA tenderness  Neuro: non focal  Data Reviewed: Basic Metabolic Panel:  Recent Labs Lab 01/15/14 1908 01/17/14 0340 01/18/14 0748  NA 135* 136* 135*  K 3.8 4.3 3.8  CL 99 104 102  CO2 23 19 21   GLUCOSE 131* 92 100*  BUN 10 6 6   CREATININE 0.93 0.65 0.64  CALCIUM 8.9 8.0* 8.3*   Liver Function Tests:  Recent Labs Lab 01/15/14 1908 01/18/14 0748  AST 19 26  ALT 15 21  ALKPHOS 42 45  BILITOT 0.7 0.3  PROT 7.0 6.0  ALBUMIN 3.4* 2.5*   CBC:  Recent Labs Lab 01/15/14 1908 01/17/14 0340 01/18/14 0748  WBC 17.8* 12.9* 8.6  NEUTROABS 15.5*  --   --   HGB 11.7* 10.5* 10.7*  HCT 34.4* 31.2* 31.0*  MCV 95.3 97.5 94.8  PLT 228 180 214   Studies: [place] Renal  01/18/2014   CLINICAL DATA:  Fever, flank pain.  EXAM: RENAL/URINARY TRACT ULTRASOUND COMPLETE  COMPARISON:  CT scan of January 15, 2014.  FINDINGS: Right Kidney:  Length: 13.1 cm. Echogenicity within normal limits. No mass or hydronephrosis visualized.  Left Kidney:  Length: 12.8 cm. Echogenicity within normal limits. No mass or hydronephrosis visualized.  Bladder:  Appears normal for degree of bladder distention.  IMPRESSION: Normal renal ultrasound.   Electronically Signed   By: Marry GuanJames  Green M.D.  On: 01/18/2014 09:51    Scheduled Meds: . cefTRIAXone (ROCEPHIN)  IV  1 g Intravenous Q24H  . enoxaparin (LOVENOX) injection  40 mg Subcutaneous QHS   Continuous Infusions: . sodium chloride 10 mL/hr at 01/17/14 1550    Principal Problem:   Sepsis Active Problems:   ASTHMA, INTERMITTENT, MILD   Pyelonephritis   Time spent: 25  This note has been created with Education officer, environmentalDragon speech recognition software and smart phrase technology. Any transcriptional errors are unintentional.   Pamella Pertostin Gherghe, MD [facility] Pager [phone]. If 7 PM - 7 AM, please contact night-coverage at [email or web address], password [email or web address] 01/18/2014, 10:13 AM  LOS: 3 days

## 2014-01-19 LAB — URINE CULTURE
CULTURE: NO GROWTH
Colony Count: NO GROWTH

## 2014-01-19 MED ORDER — CIPROFLOXACIN HCL 500 MG PO TABS: 500.0000 mg | ORAL_TABLET | Freq: Two times a day (BID) | ORAL | Status: DC

## 2014-01-19 NOTE — Progress Notes (Signed)
Patient discharged to home with family, discharge instructions reviewed with patient who verbalized understanding. New RX sent with patient. 

## 2014-01-19 NOTE — Discharge Summary (Signed)
Physician Discharge Summary  Roberta Farley [id number] DOB: 21-Nov-1985 DOA: 01/15/2014  PCP: Piedad ClimesMartin, William Cody, PA-C  Admit date: 01/15/2014 Discharge date: 01/19/2014  Time spent: 35 minutes  Recommendations for Outpatient Follow-up:  1. Follow up with PCP in 1-2 weeks   Discharge Diagnoses:  Principal Problem:   Sepsis Active Problems:   ASTHMA, INTERMITTENT, MILD   Pyelonephritis  Discharge Condition: stable  Diet recommendation: regular   Filed Weights   01/16/14 0035  Weight: 77.9 kg (171 lb 11.8 oz)   History of present illness:  Presents to ED with several day h/o fevers, chills, vomiting, right flank and lower abdominal pain. She's also had vague dysuria and noticed hematuria today. She feels weak. In the emergency room, her temperature was 103. Urinalysis shows blood and leukocyte esterase with too numerous to count white cells. White blood cell count is 17,000. The pain is crampy in nature.  Hospital Course:  Patient admitted to the hospital with sepsis due to pyelonephritis. She was started empirically on Ceftriaxone. Initial CT scan ob abdomen and pelvis showed evidence of right sided pyelonephritis corresponding to her flank pain. Patient started to improve clinically after antibiotics and her flank pain and dysuria were improving as well as her white count has normalized; she continued to have fevers though up until 6/19 am and had a renal US and repeat UA which showed improvement. On the day of discharge patient's symptoms have resolved, she has been afebrile for 24 hours and will be discharged home with oral Ciprofloxacin to complete a 2 week total antibiotic course for pyelonephritis.   Procedures:  None    Consultations:  None   Discharge Exam: Filed Vitals:   01/18/14 0535 01/18/14 1306 01/18/14 2146 01/19/14 0500  BP:  122/70 126/80 119/81  Pulse:  63 75 50  Temp: 99.2 F (37.3 C) 98.2 F (36.8 C) 98.4 F (36.9 C) 98.5 F (36.9 C)    TempSrc: Oral Oral Oral Oral  Resp:  16 16 16   Height:      Weight:      SpO2:  100% 99% 98%    General: NAD Cardiovascular: RRR Respiratory: CTA biL  Discharge Instructions    Medication List         albuterol 108 (90 BASE) MCG/ACT inhaler  Commonly known as:  PROVENTIL HFA;VENTOLIN HFA  Inhale 1-2 puffs into the lungs every 6 (six) hours as needed for wheezing or shortness of breath.     ciprofloxacin 500 MG tablet  Commonly known as:  CIPRO  Take 1 tablet (500 mg total) by mouth 2 (two) times daily.     CRANBERRY PO  Take 1 capsule by mouth daily.     ibuprofen 200 MG tablet  Commonly known as:  ADVIL,MOTRIN  Take 600 mg by mouth every 6 (six) hours as needed (pain).           Follow-up Information   Follow up with Piedad ClimesMartin, William Cody, PA-C. Schedule an appointment as soon as possible for a visit in 1 week.   Specialty:  Physician Assistant   Contact information:   [phone] [place] [id number] [phone]       The results of significant diagnostics from this hospitalization (including imaging, microbiology, ancillary and laboratory) are listed below for reference.    Significant Diagnostic Studies: Ct Abdomen Pelvis Wo Contrast  01/15/2014   CLINICAL DATA:  Right-sided flank pain.  Renal calculus.  EXAM: CT ABDOMEN AND PELVIS  WITHOUT CONTRAST  TECHNIQUE: Multidetector CT imaging of the abdomen and pelvis was performed following the standard protocol without IV contrast.  COMPARISON:  None.  FINDINGS: Bones:  No aggressive osseous lesions.  Lung Bases: Dependent atelectasis. Tiny bilateral pleural effusions.  Liver: Unenhanced CT was performed per clinician order. Lack of IV contrast limits sensitivity and specificity, especially for evaluation of abdominal/pelvic solid viscera. Grossly normal.  Spleen:  Normal.  Gallbladder:  Distended.  No calcified stones.  Common bile duct:  Normal.  Pancreas:  Normal.  Adrenal glands:  Normal  bilaterally.  Kidneys: The left kidney appears normal. Left ureter is also normal. There are inflammatory changes around the right kidney. No right renal calculi. Right periureteric inflammatory changes are present. There is no distal right ureteral stone identified. No stone is identified in the urinary bladder  Stomach:  Grossly normal.  Small bowel:  Grossly normal.  Colon: Large stool burden. Mobile cecum is present located anteriorly in the anatomic pelvis. The appendix is not identified.  Pelvic Genitourinary: Poor visualization of the adnexal structures in the absence of oral and IV contrast. Urinary bladder grossly appears within normal limits.  Vasculature: Distended inferior vena cava, likely secondary to resuscitation. This may also account for the pleural effusions.  Body Wall: Fat containing tiny periumbilical hernia.  IMPRESSION: 1. No renal calculi identified. Inflammatory changes of the right kidney and ureter may relate to recent passage of stone or ascending urinary tract infection. 2. Tiny bilateral pleural effusions likely related to high volume status.   Electronically Signed   By: Andreas Newport M.D.   On: 01/15/2014 23:20   US Renal  01/18/2014   CLINICAL DATA:  Fever, flank pain.  EXAM: RENAL/URINARY TRACT ULTRASOUND COMPLETE  COMPARISON:  CT scan of January 15, 2014.  FINDINGS: Right Kidney:  Length: 13.1 cm. Echogenicity within normal limits. No mass or hydronephrosis visualized.  Left Kidney:  Length: 12.8 cm. Echogenicity within normal limits. No mass or hydronephrosis visualized.  Bladder:  Appears normal for degree of bladder distention.  IMPRESSION: Normal renal ultrasound.   Electronically Signed   By: Roque Lias M.D.   On: 01/18/2014 09:51    Microbiology: Recent Results (from the past 240 hour(s))  URINE CULTURE     Status: None   Collection Time    01/15/14  9:15 PM      Result Value Ref Range Status   Specimen Description URINE, CLEAN CATCH   Final   Special Requests  NONE   Final   Culture  Setup Time     Final   Value: 01/16/2014 00:48     Performed at [facility] Count     Final   Value: 10,000 COLONIES/ML     Performed at [facility]   Culture     Final   Value: Multiple bacterial morphotypes present, none predominant. Suggest appropriate recollection if clinically indicated.     Performed at [facility]   Report Status 01/17/2014 FINAL   Final     Labs: Basic Metabolic Panel:  Recent Labs Lab 01/15/14 1908 01/17/14 0340 01/18/14 0748  NA 135* 136* 135*  K 3.8 4.3 3.8  CL 99 104 102  CO2 23 19 21   GLUCOSE 131* 92 100*  BUN 10 6 6   CREATININE 0.93 0.65 0.64  CALCIUM 8.9 8.0* 8.3*   Liver Function Tests:  Recent Labs Lab 01/15/14 1908 01/18/14 0748  AST 19 26  ALT 15 21  ALKPHOS 42 45  BILITOT 0.7 0.3  PROT 7.0 6.0  ALBUMIN 3.4* 2.5*   CBC:  Recent Labs Lab 01/15/14 1908 01/17/14 0340 01/18/14 0748  WBC 17.8* 12.9* 8.6  NEUTROABS 15.5*  --   --   HGB 11.7* 10.5* 10.7*  HCT 34.4* 31.2* 31.0*  MCV 95.3 97.5 94.8  PLT 228 180 214    Signed:  GHERGHE, COSTIN  [facility] 01/19/2014, 12:00 PM

## 2014-01-19 NOTE — ED Provider Notes (Signed)
Medical screening examination/treatment/procedure(s) were performed by non-physician practitioner and as supervising physician I was immediately available for consultation/collaboration.   EKG Interpretation None        Mark James, MD 01/19/14 0726 

## 2014-01-31 ENCOUNTER — Encounter: Payer: Self-pay | Admitting: Physician Assistant

## 2014-01-31 ENCOUNTER — Ambulatory Visit (INDEPENDENT_AMBULATORY_CARE_PROVIDER_SITE_OTHER): Payer: 59 | Admitting: Physician Assistant

## 2014-01-31 VITALS — BP 116/72 | HR 86 | Temp 98.4°F | Resp 16 | Ht 68.0 in | Wt 162.8 lb

## 2014-01-31 DIAGNOSIS — R3989 Other symptoms and signs involving the genitourinary system: Secondary | ICD-10-CM

## 2014-01-31 DIAGNOSIS — R399 Unspecified symptoms and signs involving the genitourinary system: Secondary | ICD-10-CM

## 2014-01-31 DIAGNOSIS — N12 Tubulo-interstitial nephritis, not specified as acute or chronic: Secondary | ICD-10-CM

## 2014-01-31 LAB — POCT URINALYSIS DIPSTICK
Bilirubin, UA: NEGATIVE
Glucose, UA: NEGATIVE
Ketones, UA: NEGATIVE
NITRITE UA: NEGATIVE
PH UA: 6.5
PROTEIN UA: NEGATIVE
Urobilinogen, UA: 0.2

## 2014-01-31 MED ORDER — CEFTRIAXONE SODIUM 1 G IJ SOLR
1.0000 g | Freq: Once | INTRAMUSCULAR | Status: AC
  Administered 2014-01-31: 1 g via INTRAMUSCULAR

## 2014-01-31 MED ORDER — CIPROFLOXACIN HCL 500 MG PO TABS: 500.0000 mg | ORAL_TABLET | Freq: Two times a day (BID) | ORAL | Status: DC

## 2014-01-31 NOTE — Assessment & Plan Note (Signed)
Resolving.  Patient much improved since hospital stay.  Does have some mild LBP and dysuria x 2 days after completing antibiotic.  Urine dip positive for blood and LE.  Will send for micro and culture.  Giving recent events, will be cautious and treat for UTI until culture results.  IM Rocephin given.  Rx Cipro. Continue good hydration. Daily cranberry supplement and probiotic.  Will recheck CBC and BMP.

## 2014-01-31 NOTE — Addendum Note (Signed)
Addended by: Regis BillSCATES, SHARON L on: 01/31/2014 04:47 PM   Modules accepted: Orders

## 2014-01-31 NOTE — Patient Instructions (Signed)
Please obtain labs.  I will call you with your results. Please take Ciprofloxacin as directed.  Continue good hydration.  Take a daily cranberry supplement and probiotic.  Advil if needed for back pain.  You have a low threshold for going to the ER.  If symptoms worsen in any way, proceed directly to the ER.

## 2014-01-31 NOTE — Progress Notes (Signed)
Patient presents to clinic today for hospital follow-up of Acute Pyelonephritis.  Patient admitted on 01/15/14 after experiencing several days of fever, chills, vomiting and flank pain.  Also had noticed some hematuria.  Was diagnosed with Pyelonephritis and sepsis.  WBC count markedly elevated at 17.8.  Patient responded well to antibiotic therapy.  Was stabilized in hospital.  Fever remained until 6/19.  Discharged home with 10 day course of Ciprofloxacin. Urine culture taken at discharge was negative for growth.  Since discharge, patient states she has been much better.  Endorses completing course of antibiotic.  Endorses since stopping antibiotic she has noted some dysuria and low back pain..  Denies fever, chills, nausea, vomiting or flank pain.  Past Medical History  Diagnosis Date  . Chlamydia   . Asthma     Current Outpatient Prescriptions on File Prior to Visit  Medication Sig Dispense Refill  . albuterol (PROVENTIL HFA;VENTOLIN HFA) 108 (90 BASE) MCG/ACT inhaler Inhale 1-2 puffs into the lungs every 6 (six) hours as needed for wheezing or shortness of breath.  1 Inhaler  0  . CRANBERRY PO Take 1 capsule by mouth daily.      Marland Kitchen ibuprofen (ADVIL,MOTRIN) 200 MG tablet Take 600 mg by mouth every 6 (six) hours as needed (pain).       No current facility-administered medications on file prior to visit.    No Known Allergies  Family History  Problem Relation Age of Onset  . Hypertension    . Congestive Heart Failure    . Diabetes    . Ovarian cancer Maternal Grandmother   . Cancer Maternal Grandmother   . Diabetes Mother   . Hypertension Mother   . Hyperlipidemia Father   . Diabetes Father     History   Social History  . Marital Status: Single    Spouse Name: N/A    Number of Children: 1  . Years of Education: N/A   Occupational History  . CNA    Social History Main Topics  . Smoking status: Former Smoker    Quit date: 06/11/2013  . Smokeless tobacco: Never Used   Comment: "Hookah" tobacco    . Alcohol Use: Yes     Comment: "socially"  . Drug Use: No  . Sexual Activity: Yes    Partners: Male    Birth Control/ Protection: Injection, None   Other Topics Concern  . None   Social History Narrative  . None   Review of Systems - See HPI.  All other ROS are negative.  BP 116/72  Pulse 86  Temp(Src) 98.4 F (36.9 C) (Oral)  Resp 16  Ht _0  (1.727 m)  Wt 162 lb 12 oz (73.823 kg)  BMI 24.75 kg/m2  SpO2 99%  LMP 01/09/2014  Physical Exam  Vitals reviewed. Constitutional: She is oriented to person, place, and time and well-developed, well-nourished, and in no distress.  HENT:  Head: Normocephalic and atraumatic.  Cardiovascular: Normal rate, regular rhythm, normal heart sounds and intact distal pulses.   Pulmonary/Chest: Effort normal and breath sounds normal. No respiratory distress. She has no wheezes. She has no rales. She exhibits no tenderness.  Abdominal: Soft. Bowel sounds are normal. She exhibits no distension and no mass. There is no tenderness. There is no rebound and no guarding.  Negative CVA tenderness bilaterally.  Neurological: She is alert and oriented to person, place, and time.  Skin: Skin is warm and dry. No rash noted.  Psychiatric: Affect normal.    Recent  Results (from the past 2160 hour(s))  CBC WITH DIFFERENTIAL     Status: Abnormal   Collection Time    01/15/14  7:08 PM      Result Value Ref Range   WBC 17.8 (*) 4.0 - 10.5 K/uL   RBC 3.61 (*) 3.87 - 5.11 MIL/uL   Hemoglobin 11.7 (*) 12.0 - 15.0 g/dL   HCT 34.4 (*) 36.0 - 46.0 %   MCV 95.3  78.0 - 100.0 fL   MCH 32.4  26.0 - 34.0 pg   MCHC 34.0  30.0 - 36.0 g/dL   RDW 12.5  11.5 - 15.5 %   Platelets 228  150 - 400 K/uL   Neutrophils Relative % 87 (*) 43 - 77 %   Neutro Abs 15.5 (*) 1.7 - 7.7 K/uL   Lymphocytes Relative 5 (*) 12 - 46 %   Lymphs Abs 0.8  0.7 - 4.0 K/uL   Monocytes Relative 8  3 - 12 %   Monocytes Absolute 1.4 (*) 0.1 - 1.0 K/uL    Eosinophils Relative 0  0 - 5 %   Eosinophils Absolute 0.0  0.0 - 0.7 K/uL   Basophils Relative 0  0 - 1 %   Basophils Absolute 0.1  0.0 - 0.1 K/uL  COMPREHENSIVE METABOLIC PANEL     Status: Abnormal   Collection Time    01/15/14  7:08 PM      Result Value Ref Range   Sodium 135 (*) 137 - 147 mEq/L   Potassium 3.8  3.7 - 5.3 mEq/L   Chloride 99  96 - 112 mEq/L   CO2 23  19 - 32 mEq/L   Glucose, Bld 131 (*) 70 - 99 mg/dL   BUN 10  6 - 23 mg/dL   Creatinine, Ser 0.93  0.50 - 1.10 mg/dL   Calcium 8.9  8.4 - 10.5 mg/dL   Total Protein 7.0  6.0 - 8.3 g/dL   Albumin 3.4 (*) 3.5 - 5.2 g/dL   AST 19  0 - 37 U/L   ALT 15  0 - 35 U/L   Alkaline Phosphatase 42  39 - 117 U/L   Total Bilirubin 0.7  0.3 - 1.2 mg/dL   GFR calc non Af Amer 82 (*) >90 mL/min   GFR calc Af Amer >90  >90 mL/min   Comment: (NOTE)     The eGFR has been calculated using the CKD EPI equation.     This calculation has not been validated in all clinical situations.     eGFR's persistently <90 mL/min signify possible Chronic Kidney     Disease.  I-STAT CG4 LACTIC ACID, ED     Status: None   Collection Time    01/15/14  7:54 PM      Result Value Ref Range   Lactic Acid, Venous 0.85  0.5 - 2.2 mmol/L  URINALYSIS, ROUTINE W REFLEX MICROSCOPIC     Status: Abnormal   Collection Time    01/15/14  9:15 PM      Result Value Ref Range   Color, Urine YELLOW  YELLOW   APPearance CLOUDY (*) CLEAR   Specific Gravity, Urine 1.009  1.005 - 1.030   pH 6.5  5.0 - 8.0   Glucose, UA NEGATIVE  NEGATIVE mg/dL   Hgb urine dipstick MODERATE (*) NEGATIVE   Bilirubin Urine NEGATIVE  NEGATIVE   Ketones, ur 40 (*) NEGATIVE mg/dL   Protein, ur 30 (*) NEGATIVE mg/dL   Urobilinogen, UA 0.2  0.0 - 1.0 mg/dL   Nitrite NEGATIVE  NEGATIVE   Leukocytes, UA LARGE (*) NEGATIVE  URINE CULTURE     Status: None   Collection Time    01/15/14  9:15 PM      Result Value Ref Range   Specimen Description URINE, CLEAN CATCH     Special Requests NONE      Culture  Setup Time       Value: 01/16/2014 00:48     Performed at [facility]       Value: 10,000 COLONIES/ML     Performed at [facility]   Culture       Value: Multiple bacterial morphotypes present, none predominant. Suggest appropriate recollection if clinically indicated.     Performed at [facility]   Report Status 01/17/2014 FINAL    URINE MICROSCOPIC-ADD ON     Status: Abnormal   Collection Time    01/15/14  9:15 PM      Result Value Ref Range   Squamous Epithelial / LPF FEW (*) RARE   WBC, UA TOO NUMEROUS TO COUNT  <3 WBC/hpf   Comment: WITH CLUMPS   RBC / HPF 0-2  <3 RBC/hpf   Bacteria, UA FEW (*) RARE  POC URINE PREG, ED     Status: None   Collection Time    01/15/14  9:18 PM      Result Value Ref Range   Preg Test, Ur NEGATIVE  NEGATIVE   Comment:            THE SENSITIVITY OF THIS     METHODOLOGY IS >24 mIU/mL  CBC     Status: Abnormal   Collection Time    01/17/14  3:40 AM      Result Value Ref Range   WBC 12.9 (*) 4.0 - 10.5 K/uL   RBC 3.20 (*) 3.87 - 5.11 MIL/uL   Hemoglobin 10.5 (*) 12.0 - 15.0 g/dL   HCT 31.2 (*) 36.0 - 46.0 %   MCV 97.5  78.0 - 100.0 fL   MCH 32.8  26.0 - 34.0 pg   MCHC 33.7  30.0 - 36.0 g/dL   RDW 12.7  11.5 - 15.5 %   Platelets 180  150 - 400 K/uL  BASIC METABOLIC PANEL     Status: Abnormal   Collection Time    01/17/14  3:40 AM      Result Value Ref Range   Sodium 136 (*) 137 - 147 mEq/L   Potassium 4.3  3.7 - 5.3 mEq/L   Comment: MODERATE HEMOLYSIS     HEMOLYSIS AT THIS LEVEL MAY AFFECT RESULT   Chloride 104  96 - 112 mEq/L   CO2 19  19 - 32 mEq/L   Glucose, Bld 92  70 - 99 mg/dL   BUN 6  6 - 23 mg/dL   Creatinine, Ser 0.65  0.50 - 1.10 mg/dL   Calcium 8.0 (*) 8.4 - 10.5 mg/dL   GFR calc non Af Amer >90  >90 mL/min   GFR calc Af Amer >90  >90 mL/min   Comment: (NOTE)     The eGFR has been calculated using the CKD EPI equation.     This calculation has not been validated in  all clinical situations.     eGFR's persistently <90 mL/min signify possible Chronic Kidney     Disease.  CBC     Status: Abnormal   Collection Time    01/18/14  7:48 AM      Result Value Ref Range   WBC 8.6  4.0 - 10.5 K/uL   RBC 3.27 (*) 3.87 - 5.11 MIL/uL   Hemoglobin 10.7 (*) 12.0 - 15.0 g/dL   HCT 31.0 (*) 36.0 - 46.0 %   MCV 94.8  78.0 - 100.0 fL   MCH 32.7  26.0 - 34.0 pg   MCHC 34.5  30.0 - 36.0 g/dL   RDW 12.2  11.5 - 15.5 %   Platelets 214  150 - 400 K/uL  COMPREHENSIVE METABOLIC PANEL     Status: Abnormal   Collection Time    01/18/14  7:48 AM      Result Value Ref Range   Sodium 135 (*) 137 - 147 mEq/L   Potassium 3.8  3.7 - 5.3 mEq/L   Chloride 102  96 - 112 mEq/L   CO2 21  19 - 32 mEq/L   Glucose, Bld 100 (*) 70 - 99 mg/dL   BUN 6  6 - 23 mg/dL   Creatinine, Ser 0.64  0.50 - 1.10 mg/dL   Calcium 8.3 (*) 8.4 - 10.5 mg/dL   Total Protein 6.0  6.0 - 8.3 g/dL   Albumin 2.5 (*) 3.5 - 5.2 g/dL   AST 26  0 - 37 U/L   Comment: NO VISIBLE HEMOLYSIS     HEMOLYSIS AT THIS LEVEL MAY AFFECT RESULT   ALT 21  0 - 35 U/L   Alkaline Phosphatase 45  39 - 117 U/L   Total Bilirubin 0.3  0.3 - 1.2 mg/dL   GFR calc non Af Amer >90  >90 mL/min   GFR calc Af Amer >90  >90 mL/min   Comment: (NOTE)     The eGFR has been calculated using the CKD EPI equation.     This calculation has not been validated in all clinical situations.     eGFR's persistently <90 mL/min signify possible Chronic Kidney     Disease.  HIV ANTIBODY (ROUTINE TESTING)     Status: None   Collection Time    01/18/14  7:48 AM      Result Value Ref Range   HIV 1&2 Ab, 4th Generation NONREACTIVE  NONREACTIVE   Comment: (NOTE)     A NONREACTIVE HIV Ag/Ab result does not exclude HIV infection since     the time frame for seroconversion is variable. If acute HIV infection     is suspected, a HIV-1 RNA Qualitative TMA test is recommended.     HIV-1/2 Antibody Diff         Not indicated.     HIV-1 RNA, Qual TMA            Not indicated.     PLEASE NOTE: This information has been disclosed to you from records     whose confidentiality may be protected by state law. If your state     requires such protection, then the state law prohibits you from making     any further disclosure of the information without the specific written     consent of the person to whom it pertains, or as otherwise permitted     by law. A general authorization for the release of medical or other     information is NOT sufficient for this purpose.     The performance of this assay has not been clinically validated in     patients less than 11 years old.     Performed at  [facility]  URINALYSIS, ROUTINE W REFLEX MICROSCOPIC     Status: Abnormal   Collection Time    01/18/14  9:09 AM      Result Value Ref Range   Color, Urine YELLOW  YELLOW   APPearance CLEAR  CLEAR   Specific Gravity, Urine 1.006  1.005 - 1.030   pH 6.5  5.0 - 8.0   Glucose, UA NEGATIVE  NEGATIVE mg/dL   Hgb urine dipstick NEGATIVE  NEGATIVE   Bilirubin Urine NEGATIVE  NEGATIVE   Ketones, ur 40 (*) NEGATIVE mg/dL   Protein, ur NEGATIVE  NEGATIVE mg/dL   Urobilinogen, UA 0.2  0.0 - 1.0 mg/dL   Nitrite NEGATIVE  NEGATIVE   Leukocytes, UA SMALL (*) NEGATIVE  URINE MICROSCOPIC-ADD ON     Status: Abnormal   Collection Time    01/18/14  9:09 AM      Result Value Ref Range   Squamous Epithelial / LPF FEW (*) RARE   WBC, UA 3-6  <3 WBC/hpf  URINE CULTURE     Status: None   Collection Time    01/18/14 11:39 AM      Result Value Ref Range   Specimen Description URINE, RANDOM     Special Requests NONE     Culture  Setup Time       Value: 01/18/2014 19:38     Performed at [facility]       Value: NO GROWTH     Performed at [facility]   Culture       Value: NO GROWTH     Performed at [facility]   Report Status 01/19/2014 FINAL      Assessment/Plan: Pyelonephritis Resolving.  Patient much improved  since hospital stay.  Does have some mild LBP and dysuria x 2 days after completing antibiotic.  Urine dip positive for blood and LE.  Will send for micro and culture.  Giving recent events, will be cautious and treat for UTI until culture results.  IM Rocephin given.  Rx Cipro. Continue good hydration. Daily cranberry supplement and probiotic.  Will recheck CBC and BMP.

## 2014-01-31 NOTE — Progress Notes (Signed)
Pre visit review using our clinic review tool, if applicable. No additional management support is needed unless otherwise documented below in the visit note/SLS  

## 2014-02-01 LAB — URINALYSIS, ROUTINE W REFLEX MICROSCOPIC
BILIRUBIN URINE: NEGATIVE
Glucose, UA: NEGATIVE mg/dL
Hgb urine dipstick: NEGATIVE
KETONES UR: NEGATIVE mg/dL
Nitrite: NEGATIVE
PH: 7 (ref 5.0–8.0)
Protein, ur: NEGATIVE mg/dL
Specific Gravity, Urine: 1.006 (ref 1.005–1.030)
Urobilinogen, UA: 0.2 mg/dL (ref 0.0–1.0)

## 2014-02-01 LAB — URINALYSIS, MICROSCOPIC ONLY
Bacteria, UA: NONE SEEN
Casts: NONE SEEN
Crystals: NONE SEEN
Squamous Epithelial / LPF: NONE SEEN

## 2014-02-01 LAB — CULTURE, URINE COMPREHENSIVE
Colony Count: NO GROWTH
Organism ID, Bacteria: NO GROWTH

## 2014-05-08 ENCOUNTER — Telehealth: Payer: Self-pay | Admitting: Medical

## 2014-05-08 ENCOUNTER — Ambulatory Visit (INDEPENDENT_AMBULATORY_CARE_PROVIDER_SITE_OTHER): Payer: 59 | Admitting: Medical

## 2014-05-08 ENCOUNTER — Encounter: Payer: Self-pay | Admitting: Medical

## 2014-05-08 VITALS — BP 127/87 | HR 83 | Temp 99.4°F | Ht 68.5 in | Wt 167.0 lb

## 2014-05-08 DIAGNOSIS — N912 Amenorrhea, unspecified: Secondary | ICD-10-CM

## 2014-05-08 DIAGNOSIS — M549 Dorsalgia, unspecified: Secondary | ICD-10-CM | POA: Insufficient documentation

## 2014-05-08 DIAGNOSIS — Z3490 Encounter for supervision of normal pregnancy, unspecified, unspecified trimester: Secondary | ICD-10-CM | POA: Insufficient documentation

## 2014-05-08 DIAGNOSIS — M545 Low back pain, unspecified: Secondary | ICD-10-CM

## 2014-05-08 DIAGNOSIS — M5489 Other dorsalgia: Secondary | ICD-10-CM

## 2014-05-08 DIAGNOSIS — Z3200 Encounter for pregnancy test, result unknown: Secondary | ICD-10-CM

## 2014-05-08 LAB — POCT URINALYSIS DIPSTICK
Bilirubin, UA: NEGATIVE
GLUCOSE UA: NEGATIVE
Ketones, UA: NEGATIVE
NITRITE UA: NEGATIVE
Spec Grav, UA: 1.015
Urobilinogen, UA: 0.2
pH, UA: 7.5

## 2014-05-08 LAB — POCT URINE PREGNANCY: PREG TEST UR: POSITIVE

## 2014-05-08 MED ORDER — COMPLETENATE 29-1 MG PO CHEW: 1.0000 | CHEWABLE_TABLET | Freq: Every day | ORAL | Status: DC

## 2014-05-08 NOTE — Patient Instructions (Addendum)
I did rx your prenatal vitamins. I want you to quickly make appointment with your ob. If you are having issues getting in let us know. If you have any cramping, bleeding or  discharge can be seen by medical provider(ED or  UC). Follow up here as needed.  We are sending your urine out for culture to see if has infection based on your history although you don't have symptoms now.  Pt will make appointment with OB herself. I explained to her based on her age, non complicated prior pregnancy , and no major med problem other than asthma that we don't need to refer.

## 2014-05-08 NOTE — Telephone Encounter (Signed)
Call pt and ask her to check her bp every day and document. Call us on Friday afternoon early. If she is having high bp reading that would be reason to see ob sooner than there standard time for most pregnant pt.

## 2014-05-08 NOTE — Progress Notes (Signed)
Subjective:    Patient ID: Roberta Farley, female    DOB: Dec 25, 1985, 28 y.o.   MRN: [id number]  HPI  Pt got her last menses sept 3rd and normal amount and expected time(Every other cycle was regular and this is first skipped cycle). Pt took recent pregnancy test last 3 days and had 2 positives. Mild nausea in am and some mild lower abdomen cramps. Also some reflux. She does not have this before. Pt has 30 yr old girl. Some back pain. No dysuria. Pt does report history of kidney infection in June.   With her previous pregnancy was non eventful pregnancy. Child born full term and heatlhy.  Mother has no asthma signs or symptoms today. Pt obstetrician still in area and working.  Past Medical History  Diagnosis Date  . Chlamydia   . Asthma     History   Social History  . Marital Status: Single    Spouse Name: N/A    Number of Children: 1  . Years of Education: N/A   Occupational History  . CNA    Social History Main Topics  . Smoking status: Former Smoker    Quit date: 06/11/2013  . Smokeless tobacco: Never Used     Comment: "Hookah" tobacco    . Alcohol Use: Yes     Comment: "socially"  . Drug Use: No  . Sexual Activity: Yes    Partners: Male    Birth Control/ Protection: Injection, None   Other Topics Concern  . Not on file   Social History Narrative  . No narrative on file    Past Surgical History  Procedure Laterality Date  . Induced abortion      x 2    Family History  Problem Relation Age of Onset  . Hypertension    . Congestive Heart Failure    . Diabetes    . Ovarian cancer Maternal Grandmother   . Cancer Maternal Grandmother   . Diabetes Mother   . Hypertension Mother   . Hyperlipidemia Father   . Diabetes Father     No Known Allergies  Current Outpatient Prescriptions on File Prior to Visit  Medication Sig Dispense Refill  . albuterol (PROVENTIL HFA;VENTOLIN HFA) 108 (90 BASE) MCG/ACT inhaler Inhale 1-2 puffs into the lungs every 6  (six) hours as needed for wheezing or shortness of breath.  1 Inhaler  0  . ibuprofen (ADVIL,MOTRIN) 200 MG tablet Take 600 mg by mouth every 6 (six) hours as needed (pain).      . CRANBERRY PO Take 1 capsule by mouth daily.       No current facility-administered medications on file prior to visit.    BP 127/87  Pulse 83  Temp(Src) 99.4 F (37.4 C) (Oral)  Ht 5' 8.5" (1.74 m)  Wt 167 lb (75.751 kg)  BMI 25.02 kg/m2  SpO2 99%  LMP 04/04/2014     Review of Systems  Constitutional: Negative for fever, chills and fatigue.  HENT: Negative.   Respiratory: Negative for cough, chest tightness, shortness of breath and wheezing.   Cardiovascular: Negative for chest pain and palpitations.  Gastrointestinal: Negative for nausea, vomiting, abdominal pain, diarrhea, constipation, blood in stool, abdominal distention, anal bleeding and rectal pain.  Genitourinary: Negative for dysuria, urgency, frequency, hematuria, flank pain, pelvic pain and dyspareunia.       Minimal lower abdomen cramping sensation. On and off. No vaginal bleeding.  Musculoskeletal: Positive for back pain.       Minimal  and she states hx of kidney infection in summer that took a while to resolve.  Neurological: Negative.  Negative for dizziness, tremors, seizures, syncope, facial asymmetry, speech difficulty, weakness, light-headedness, numbness and headaches.  Hematological: Negative for adenopathy. Does not bruise/bleed easily.  Psychiatric/Behavioral: Negative.        Objective:   Physical Exam  General Appearance- Not in acute distress.  HEENT Eyes- Scleraeral/Conjuntiva-bilat- Not Yellow. Mouth & Throat- Normal.  Chest and Lung Exam Auscultation: Breath sounds:-Normal. Adventitious sounds:- No Adventitious sounds.  Cardiovascular Auscultation:Rythm - Regular. Heart Sounds -Normal heart sounds.  Abdomen Inspection:-Inspection Normal.  Palpation: Palpation- Non Tender, No Rebound tenderness, No  rigidity(Guarding) and No Palpable abdominal masses.  Liver:-Normal.  Spleen:- Normal.   Back- no cva tenderness        Assessment & Plan:  Nausea and faint minimal cramping sound to be normal variant.  Urine culture placed by lpn and she verified.  Will get lpn to ask pt to call us on her blood pressure readings over next 3 days. If bp are elevated then we would try to get her in with ob sooner. Presently she will make appointment herself.

## 2014-05-08 NOTE — Assessment & Plan Note (Signed)
Pt new pregnant. Last menses early September. No previous comlpications with  Prior pregnancy. No major medical problems except asthma and that is controlled.

## 2014-05-08 NOTE — Assessment & Plan Note (Signed)
  Very faint back pain. No cva region but she reports in summer had back pain and eventually diagnosed with kidney infection. So in light of her pregnancy will get  Urine culture and treat if positive for infection. Pt will up date if her minimal faint back discomfort worsens or if other symptoms.

## 2014-05-08 NOTE — Progress Notes (Signed)
Pre visit review using our clinic review tool, if applicable. No additional management support is needed unless otherwise documented below in the visit note. 

## 2014-05-09 LAB — URINE CULTURE
COLONY COUNT: NO GROWTH
ORGANISM ID, BACTERIA: NO GROWTH

## 2014-05-09 NOTE — Telephone Encounter (Signed)
Patient returned call advised to record BP today and tomorrow and to Call early around noon tomorrow with results. Advised we just need to see if BP elevated she may need to see OB sooner. Patient agreed.

## 2014-05-10 ENCOUNTER — Telehealth: Payer: Self-pay | Admitting: Medical

## 2014-05-10 DIAGNOSIS — Z3490 Encounter for supervision of normal pregnancy, unspecified, unspecified trimester: Secondary | ICD-10-CM

## 2014-05-10 NOTE — Telephone Encounter (Signed)
[phone]   Pt called in with BP results.   Call back if there are any questions.  124/68 - today. 140/80 - yesterday.

## 2014-05-10 NOTE — Telephone Encounter (Signed)
Pt called with 2 bp readings. One was 140/80 today. The other was 124/68. Last time in was 127/87.I will get Migdalia DkJennifer Sebastian to notify her gynecologist of these readings. Gyn probably won't see her sooner than normal based on these readings alone. But still notify them. I think patient was going to contact her gyn so would you call her and find out who she uses. If they don't think these are significant since only one mild high systolic and one mild high diastolic so pt will be checking her bp daily and notify us on 19th of her bp readings.

## 2014-05-13 NOTE — Telephone Encounter (Signed)
Pt being seen today by GYN Dr Henderson CloudMBLIN

## 2014-05-14 NOTE — Telephone Encounter (Signed)
Patient should call Ob/GYN to see if she can be seen sooner if appointment is not within next 1.5 weeks.

## 2014-05-15 NOTE — Telephone Encounter (Signed)
Called patient x 2 days unable to leave message due to mailbox being full. Letter mailed regarding OB visit being moved up if not in 1.5 weeks due to blood pressure.

## 2014-06-03 ENCOUNTER — Encounter: Payer: Self-pay | Admitting: Medical

## 2014-06-03 LAB — OB RESULTS CONSOLE RPR: RPR: NONREACTIVE

## 2014-06-03 LAB — OB RESULTS CONSOLE ANTIBODY SCREEN: Antibody Screen: NEGATIVE

## 2014-06-03 LAB — OB RESULTS CONSOLE RUBELLA ANTIBODY, IGM: Rubella: IMMUNE

## 2014-06-03 LAB — OB RESULTS CONSOLE HIV ANTIBODY (ROUTINE TESTING): HIV: NONREACTIVE

## 2014-06-03 LAB — OB RESULTS CONSOLE HEPATITIS B SURFACE ANTIGEN: Hepatitis B Surface Ag: NEGATIVE

## 2014-06-09 ENCOUNTER — Encounter (HOSPITAL_COMMUNITY): Payer: Self-pay | Admitting: *Deleted

## 2014-06-09 ENCOUNTER — Emergency Department (HOSPITAL_COMMUNITY)
Admission: EM | Admit: 2014-06-09 | Discharge: 2014-06-09 | Disposition: A | Discharge status: Home / Self Care | Payer: 59 | Attending: Emergency Medicine | Admitting: Emergency Medicine

## 2014-06-09 ENCOUNTER — Emergency Department (HOSPITAL_COMMUNITY): Payer: 59

## 2014-06-09 DIAGNOSIS — N938 Other specified abnormal uterine and vaginal bleeding: Secondary | ICD-10-CM | POA: Diagnosis present

## 2014-06-09 DIAGNOSIS — Z87891 Personal history of nicotine dependence: Secondary | ICD-10-CM | POA: Insufficient documentation

## 2014-06-09 DIAGNOSIS — Z79899 Other long term (current) drug therapy: Secondary | ICD-10-CM | POA: Diagnosis not present

## 2014-06-09 DIAGNOSIS — O209 Hemorrhage in early pregnancy, unspecified: Secondary | ICD-10-CM

## 2014-06-09 DIAGNOSIS — O418X10 Other specified disorders of amniotic fluid and membranes, first trimester, not applicable or unspecified: Secondary | ICD-10-CM

## 2014-06-09 DIAGNOSIS — O200 Threatened abortion: Secondary | ICD-10-CM | POA: Diagnosis not present

## 2014-06-09 DIAGNOSIS — Z9889 Other specified postprocedural states: Secondary | ICD-10-CM | POA: Insufficient documentation

## 2014-06-09 DIAGNOSIS — O468X1 Other antepartum hemorrhage, first trimester: Secondary | ICD-10-CM

## 2014-06-09 DIAGNOSIS — Z3A09 9 weeks gestation of pregnancy: Secondary | ICD-10-CM | POA: Diagnosis not present

## 2014-06-09 DIAGNOSIS — Z8619 Personal history of other infectious and parasitic diseases: Secondary | ICD-10-CM | POA: Diagnosis not present

## 2014-06-09 DIAGNOSIS — J45909 Unspecified asthma, uncomplicated: Secondary | ICD-10-CM | POA: Insufficient documentation

## 2014-06-09 LAB — I-STAT CHEM 8, ED
BUN: 6 mg/dL (ref 6–23)
CALCIUM ION: 1.14 mmol/L (ref 1.12–1.23)
CREATININE: 0.6 mg/dL (ref 0.50–1.10)
Chloride: 105 mEq/L (ref 96–112)
GLUCOSE: 100 mg/dL — AB (ref 70–99)
HCT: 38 % (ref 36.0–46.0)
HEMOGLOBIN: 12.9 g/dL (ref 12.0–15.0)
Potassium: 3.4 mEq/L — ABNORMAL LOW (ref 3.7–5.3)
Sodium: 138 mEq/L (ref 137–147)
TCO2: 20 mmol/L (ref 0–100)

## 2014-06-09 LAB — HCG, QUANTITATIVE, PREGNANCY: HCG, BETA CHAIN, QUANT, S: 53925 m[IU]/mL — AB (ref <5)

## 2014-06-09 LAB — ABO/RH: ABO/RH(D): O POS
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
## 2014-06-09 NOTE — ED Notes (Signed)
Pt comfortable with discharge and follow up instructions. No prescriptions. 
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
## 2014-06-09 NOTE — Discharge Instructions (Signed)
Read the information below.  You may return to the Emergency Department at any time for worsening condition or any new symptoms that concern you.  If you develop high fevers, worsening abdominal pain, heavy or uncontrolled bleeding, or are unable to tolerate fluids by mouth, please [facility].      Threatened Miscarriage A threatened miscarriage occurs when you have vaginal bleeding during your first 20 weeks of pregnancy but the pregnancy has not ended. If you have vaginal bleeding during this time, your health care provider will do tests to make sure you are still pregnant. If the tests show you are still pregnant and the developing baby (fetus) inside your womb (uterus) is still growing, your condition is considered a threatened miscarriage. A threatened miscarriage does not mean your pregnancy will end, but it does increase the risk of losing your pregnancy (complete miscarriage). CAUSES  The cause of a threatened miscarriage is usually not known. If you go on to have a complete miscarriage, the most common cause is an abnormal number of chromosomes in the developing baby. Chromosomes are the structures inside cells that hold all your genetic material. Some causes of vaginal bleeding that do not result in miscarriage include:  Having sex.  Having an infection.  Normal hormone changes of pregnancy.  Bleeding that occurs when an egg implants in your uterus. RISK FACTORS Risk factors for bleeding in early pregnancy include:  Obesity.  Smoking.  Drinking excessive amounts of alcohol or caffeine.  Recreational drug use. SIGNS AND SYMPTOMS  Light vaginal bleeding.  Mild abdominal pain or cramps. DIAGNOSIS  If you have bleeding with or without abdominal pain before 20 weeks of pregnancy, your health care provider will do tests to check whether you are still pregnant. One important test involves using sound waves and a computer (ultrasound) to create  images of the inside of your uterus. Other tests include an internal exam of your vagina and uterus (pelvic exam) and measurement of your baby's heart rate.  You may be diagnosed with a threatened miscarriage if:  Ultrasound testing shows you are still pregnant.  Your baby's heart rate is strong.  A pelvic exam shows that the opening between your uterus and your vagina (cervix) is closed.  Your heart rate and blood pressure are stable.  Blood tests confirm you are still pregnant. TREATMENT  No treatments have been shown to prevent a threatened miscarriage from going on to a complete miscarriage. However, the right home care is important.  HOME CARE INSTRUCTIONS   Make sure you keep all your appointments for prenatal care. This is very important.  Get plenty of rest.  Do not have sex or use tampons if you have vaginal bleeding.  Do not douche.  Do not smoke or use recreational drugs.  Do not drink alcohol.  Avoid caffeine. SEEK MEDICAL CARE IF:  You have light vaginal bleeding or spotting while pregnant.  You have abdominal pain or cramping.  You have a fever. SEEK IMMEDIATE MEDICAL CARE IF:  You have heavy vaginal bleeding.  You have blood clots coming from your vagina.  You have severe low back pain or abdominal cramps.  You have fever, chills, and severe abdominal pain. MAKE SURE YOU:  Understand these instructions.  Will watch your condition.  Will get help right away if you are not doing well or get worse. Document Released: 07/19/2005 Document Revised: 07/24/2013 Document Reviewed: 05/15/2013 [facility] Patient Information 2015 [place], [place]. This information  is not intended to replace advice given to you by your health care provider. Make sure you discuss any questions you have with your health care provider.  Subchorionic Hematoma A subchorionic hematoma is a gathering of blood between the outer wall of the placenta and the inner wall of the womb  (uterus). The placenta is the organ that connects the fetus to the wall of the uterus. The placenta performs the feeding, breathing (oxygen to the fetus), and waste removal (excretory work) of the fetus.  Subchorionic hematoma is the most common abnormality found on a result from ultrasonography done during the first trimester or early second trimester of pregnancy. If there has been little or no vaginal bleeding, early small hematomas usually shrink on their own and do not affect your baby or pregnancy. The blood is gradually absorbed over 1-2 weeks. When bleeding starts later in pregnancy or the hematoma is larger or occurs in an older pregnant woman, the outcome may not be as good. Larger hematomas may get bigger, which increases the chances for miscarriage. Subchorionic hematoma also increases the risk of premature detachment of the placenta from the uterus, preterm (premature) labor, and stillbirth. HOME CARE INSTRUCTIONS  Stay on bed rest if your health care provider recommends this. Although bed rest will not prevent more bleeding or prevent a miscarriage, your health care provider may recommend bed rest until you are advised otherwise.  Avoid heavy lifting (more than 10 lb [4.5 kg]), exercise, sexual intercourse, or douching as directed by your health care provider.  Keep track of the number of pads you use each day and how soaked (saturated) they are. Write down this information.  Do not use tampons.  Keep all follow-up appointments as directed by your health care provider. Your health care provider may ask you to have follow-up blood tests or ultrasound tests or both. SEEK IMMEDIATE MEDICAL CARE IF:  You have severe cramps in your stomach, back, abdomen, or pelvis.  You have a fever.  You pass large clots or tissue. Save any tissue for your health care provider to look at.  Your bleeding increases or you become lightheaded, feel weak, or have fainting episodes. Document Released:  11/03/2006 Document Revised: 12/03/2013 Document Reviewed: 02/15/2013 [facility] Patient Information 2015 [place], [place]. This information is not intended to replace advice given to you by your health care provider. Make sure you discuss any questions you have with your health care provider.  Pelvic Rest Pelvic rest is sometimes recommended for women when:   The placenta is partially or completely covering the opening of the cervix (placenta previa).  There is bleeding between the uterine wall and the amniotic sac in the first trimester (subchorionic hemorrhage).  The cervix begins to open without labor starting (incompetent cervix, cervical insufficiency).  The labor is too early (preterm labor). HOME CARE INSTRUCTIONS  Do not have sexual intercourse, stimulation, or an orgasm.  Do not use tampons, douche, or put anything in the vagina.  Do not lift anything over 10 pounds (4.5 kg).  Avoid strenuous activity or straining your pelvic muscles. SEEK MEDICAL CARE IF:  You have any vaginal bleeding during pregnancy. Treat this as a potential emergency.  You have cramping pain felt low in the stomach (stronger than menstrual cramps).  You notice vaginal discharge (watery, mucus, or bloody).  You have a low, dull backache.  There are regular contractions or uterine tightening. SEEK IMMEDIATE MEDICAL CARE IF: You have vaginal bleeding and have placenta previa.  Document Released:  11/13/2010 Document Revised: 10/11/2011 Document Reviewed: 11/13/2010 ExitCare Patient Information 2015 [place], [place]. This information is not intended to replace advice given to you by your health care provider. Make sure you discuss any questions you have with your health care provider.  Vaginal Bleeding During Pregnancy, First Trimester A small amount of bleeding (spotting) from the vagina is relatively common in early pregnancy. It usually stops on its own. Various things may cause bleeding or spotting  in early pregnancy. Some bleeding may be related to the pregnancy, and some may not. In most cases, the bleeding is normal and is not a problem. However, bleeding can also be a sign of something serious. Be sure to tell your health care provider about any vaginal bleeding right away. Some possible causes of vaginal bleeding during the first trimester include:  Infection or inflammation of the cervix.  Growths (polyps) on the cervix.  Miscarriage or threatened miscarriage.  Pregnancy tissue has developed outside of the uterus and in a fallopian tube (tubal pregnancy).  Tiny cysts have developed in the uterus instead of pregnancy tissue (molar pregnancy). HOME CARE INSTRUCTIONS  Watch your condition for any changes. The following actions may help to lessen any discomfort you are feeling:  Follow your health care provider's instructions for limiting your activity. If your health care provider orders bed rest, you may need to stay in bed and only get up to use the bathroom. However, your health care provider may allow you to continue light activity.  If needed, make plans for someone to help with your regular activities and responsibilities while you are on bed rest.  Keep track of the number of pads you use each day, how often you change pads, and how soaked (saturated) they are. Write this down.  Do not use tampons. Do not douche.  Do not have sexual intercourse or orgasms until approved by your health care provider.  If you pass any tissue from your vagina, save the tissue so you can show it to your health care provider.  Only take over-the-counter or prescription medicines as directed by your health care provider.  Do not take aspirin because it can make you bleed.  Keep all follow-up appointments as directed by your health care provider. SEEK MEDICAL CARE IF:  You have any vaginal bleeding during any part of your pregnancy.  You have cramps or labor pains.  You have a fever,  not controlled by medicine. SEEK IMMEDIATE MEDICAL CARE IF:   You have severe cramps in your back or belly (abdomen).  You pass large clots or tissue from your vagina.  Your bleeding increases.  You feel light-headed or weak, or you have fainting episodes.  You have chills.  You are leaking fluid or have a gush of fluid from your vagina.  You pass out while having a bowel movement. MAKE SURE YOU:  Understand these instructions.  Will watch your condition.  Will get help right away if you are not doing well or get worse. Document Released: 04/28/2005 Document Revised: 07/24/2013 Document Reviewed: 03/26/2013 [facility] Patient Information 2015 [place], [place]. This information is not intended to replace advice given to you by your health care provider. Make sure you discuss any questions you have with your health care provider.
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
## 2014-08-26 ENCOUNTER — Other Ambulatory Visit: Payer: Self-pay | Admitting: Internal Medicine

## 2014-08-26 ENCOUNTER — Telehealth: Payer: Self-pay | Admitting: *Deleted

## 2014-08-26 ENCOUNTER — Telehealth: Payer: Self-pay | Admitting: Physician Assistant

## 2014-08-26 DIAGNOSIS — J4521 Mild intermittent asthma with (acute) exacerbation: Secondary | ICD-10-CM

## 2014-08-26 MED ORDER — ALBUTEROL SULFATE HFA 108 (90 BASE) MCG/ACT IN AERS: 1.0000 | INHALATION_SPRAY | Freq: Four times a day (QID) | RESPIRATORY_TRACT | Status: DC | PRN

## 2014-08-26 NOTE — Telephone Encounter (Signed)
Error

## 2014-08-26 NOTE — Telephone Encounter (Signed)
pls let pt know that I did send earlier today.

## 2014-08-26 NOTE — Telephone Encounter (Signed)
Patient is Pregnant, as of 10.07.15 OV note [w/Edward, last menstrual was 09.03.15]; also, pt seen in ED 11.08.15 with Dx:  Diagnoses this visit    Your diagnoses were VAGINAL BLEEDING BEFORE [redacted] WEEKS GESTATION, SUBCHORIONIC HEMORRHAGE, FIRST TRIMESTER, and THREATENED ABORTION IN FIRST TRIMESTER.   Pt requesting refill on Albuterol HFA for Mild, intermittent Asthma; Please Advise on refills/SLS

## 2014-08-26 NOTE — Telephone Encounter (Signed)
Caller name: Shaquna Bradford RIda Rogueelationship to patient:Self Can be [phone] Pharmacy: [facility] [address]  Reason for call: PT needing RX for albuterol called into pharmacy- ran out this weekend

## 2014-08-26 NOTE — Telephone Encounter (Signed)
Pt called back to see if RX had been sent in for her an inhaler.  She states she needs it tonight.  Please advise.

## 2014-08-26 NOTE — Telephone Encounter (Signed)
Notified pt. 

## 2014-09-02 ENCOUNTER — Telehealth: Payer: Self-pay | Admitting: Physician Assistant

## 2014-09-02 NOTE — Telephone Encounter (Signed)
Patient will need to speak with E,Saguier before letter can be written.

## 2014-09-02 NOTE — Telephone Encounter (Signed)
Caller name: Luna KitchensLatasha Relation to pt: self  Call back number: [phone] Pharmacy:  Reason for call:   Patient states that she is trying to apply for Medicaid family planning and needs a letter from our office showing that she had a positive pregnancy result and the date. Saw Edward for this.

## 2014-09-03 ENCOUNTER — Telehealth: Payer: Self-pay | Admitting: Medical

## 2014-09-03 NOTE — Telephone Encounter (Signed)
Pt has been seeing her ob. Not having any problems. She states pregnancy going well. She only needs letter from me stating she is pregnant for supplemental care with medicaid. I will sign and have our staff send letter out.

## 2014-09-17 NOTE — Telephone Encounter (Addendum)
Caller name:Roberta Farley, Roberta Farley Relation to pt: self  Call back number: [phone]  Reason for call:  Pt called to check on the status of letter, letter was faxed today to Harlin RainKisha Adams [phone] and [phone] at pt request.

## 2015-01-01 LAB — OB RESULTS CONSOLE GBS: GBS: POSITIVE

## 2015-01-09 ENCOUNTER — Inpatient Hospital Stay (HOSPITAL_COMMUNITY): Admission: AD | Admit: 2015-01-09 | Payer: 59 | Source: Ambulatory Visit | Admitting: Obstetrics and Gynecology

## 2015-01-13 ENCOUNTER — Telehealth (HOSPITAL_COMMUNITY): Payer: Self-pay | Admitting: *Deleted

## 2015-01-13 ENCOUNTER — Encounter (HOSPITAL_COMMUNITY): Payer: Self-pay | Admitting: *Deleted

## 2015-01-13 NOTE — Telephone Encounter (Signed)
Preadmission screen  

## 2015-01-15 ENCOUNTER — Inpatient Hospital Stay (HOSPITAL_COMMUNITY)
Admission: RE | Admit: 2015-01-15 | Discharge: 2015-01-17 | DRG: 775 | Disposition: A | Discharge status: Home / Self Care | Payer: 59 | Source: Ambulatory Visit | Attending: Obstetrics and Gynecology | Admitting: Obstetrics and Gynecology

## 2015-01-15 ENCOUNTER — Encounter (HOSPITAL_COMMUNITY): Payer: Self-pay

## 2015-01-15 DIAGNOSIS — Z3490 Encounter for supervision of normal pregnancy, unspecified, unspecified trimester: Secondary | ICD-10-CM

## 2015-01-15 DIAGNOSIS — Z3A40 40 weeks gestation of pregnancy: Secondary | ICD-10-CM | POA: Diagnosis present

## 2015-01-15 DIAGNOSIS — O9952 Diseases of the respiratory system complicating childbirth: Secondary | ICD-10-CM | POA: Diagnosis present

## 2015-01-15 DIAGNOSIS — O99824 Streptococcus B carrier state complicating childbirth: Secondary | ICD-10-CM | POA: Diagnosis present

## 2015-01-15 DIAGNOSIS — J45909 Unspecified asthma, uncomplicated: Secondary | ICD-10-CM | POA: Diagnosis present

## 2015-01-15 DIAGNOSIS — Z87891 Personal history of nicotine dependence: Secondary | ICD-10-CM | POA: Diagnosis not present

## 2015-01-15 LAB — CBC
HEMATOCRIT: 37.4 % (ref 36.0–46.0)
HEMOGLOBIN: 13.4 g/dL (ref 12.0–15.0)
MCH: 33.7 pg (ref 26.0–34.0)
MCHC: 35.8 g/dL (ref 30.0–36.0)
MCV: 94 fL (ref 78.0–100.0)
Platelets: 209 10*3/uL (ref 150–400)
RBC: 3.98 MIL/uL (ref 3.87–5.11)
RDW: 13.1 % (ref 11.5–15.5)
WBC: 10.9 10*3/uL — ABNORMAL HIGH (ref 4.0–10.5)

## 2015-01-15 LAB — TYPE AND SCREEN
ABO/RH(D): O POS
Antibody Screen: NEGATIVE

## 2015-01-15 LAB — RPR: RPR Ser Ql: NONREACTIVE

## 2015-01-15 MED ORDER — DIBUCAINE 1 % RE OINT: 1.0000 "application " | TOPICAL_OINTMENT | RECTAL | Status: DC | PRN

## 2015-01-15 MED ORDER — LIDOCAINE HCL (PF) 1 % IJ SOLN
30.0000 mL | INTRAMUSCULAR | Status: AC | PRN
  Administered 2015-01-15: 30 mL via SUBCUTANEOUS
  Filled 2015-01-15: qty 30

## 2015-01-15 MED ORDER — CITRIC ACID-SODIUM CITRATE 334-500 MG/5ML PO SOLN: 30.0000 mL | ORAL | Status: DC | PRN

## 2015-01-15 MED ORDER — TERBUTALINE SULFATE 1 MG/ML IJ SOLN
0.2500 mg | Freq: Once | INTRAMUSCULAR | Status: DC | PRN
Start: 2015-01-15 — End: 2015-01-15
  Filled 2015-01-15: qty 1

## 2015-01-15 MED ORDER — ONDANSETRON HCL 4 MG/2ML IJ SOLN: 4.0000 mg | INTRAMUSCULAR | Status: DC | PRN

## 2015-01-15 MED ORDER — ALBUTEROL SULFATE HFA 108 (90 BASE) MCG/ACT IN AERS: 1.0000 | INHALATION_SPRAY | Freq: Four times a day (QID) | RESPIRATORY_TRACT | Status: DC | PRN

## 2015-01-15 MED ORDER — OXYCODONE-ACETAMINOPHEN 5-325 MG PO TABS
2.0000 | ORAL_TABLET | ORAL | Status: DC | PRN
  Administered 2015-01-15 – 2015-01-16 (×3): 2 via ORAL
  Filled 2015-01-15 (×4): qty 2

## 2015-01-15 MED ORDER — EPHEDRINE 5 MG/ML INJ
10.0000 mg | INTRAVENOUS | Status: DC | PRN
  Filled 2015-01-15: qty 2

## 2015-01-15 MED ORDER — ALBUTEROL SULFATE (2.5 MG/3ML) 0.083% IN NEBU: 2.5000 mg | INHALATION_SOLUTION | Freq: Four times a day (QID) | RESPIRATORY_TRACT | Status: DC | PRN

## 2015-01-15 MED ORDER — ACETAMINOPHEN 325 MG PO TABS: 650.0000 mg | ORAL_TABLET | ORAL | Status: DC | PRN

## 2015-01-15 MED ORDER — BISACODYL 10 MG RE SUPP: 10.0000 mg | Freq: Every day | RECTAL | Status: DC | PRN

## 2015-01-15 MED ORDER — FENTANYL 2.5 MCG/ML BUPIVACAINE 1/10 % EPIDURAL INFUSION (WH - ANES): 14.0000 mL/h | INTRAMUSCULAR | Status: DC | PRN

## 2015-01-15 MED ORDER — ZOLPIDEM TARTRATE 5 MG PO TABS: 5.0000 mg | ORAL_TABLET | Freq: Every evening | ORAL | Status: DC | PRN

## 2015-01-15 MED ORDER — OXYTOCIN BOLUS FROM INFUSION: 500.0000 mL | INTRAVENOUS | Status: DC

## 2015-01-15 MED ORDER — PHENYLEPHRINE 40 MCG/ML (10ML) SYRINGE FOR IV PUSH (FOR BLOOD PRESSURE SUPPORT)
80.0000 ug | PREFILLED_SYRINGE | INTRAVENOUS | Status: DC | PRN
  Filled 2015-01-15: qty 2

## 2015-01-15 MED ORDER — IBUPROFEN 600 MG PO TABS
600.0000 mg | ORAL_TABLET | Freq: Four times a day (QID) | ORAL | Status: DC
  Administered 2015-01-15 – 2015-01-17 (×8): 600 mg via ORAL
  Filled 2015-01-15 (×8): qty 1

## 2015-01-15 MED ORDER — BUTORPHANOL TARTRATE 1 MG/ML IJ SOLN
1.0000 mg | INTRAMUSCULAR | Status: DC | PRN
  Administered 2015-01-15 (×2): 1 mg via INTRAVENOUS
  Filled 2015-01-15 (×2): qty 1

## 2015-01-15 MED ORDER — PENICILLIN G POTASSIUM 5000000 UNITS IJ SOLR
2.5000 10*6.[IU] | INTRAVENOUS | Status: DC
  Filled 2015-01-15 (×5): qty 2.5

## 2015-01-15 MED ORDER — LACTATED RINGERS IV SOLN: 500.0000 mL | INTRAVENOUS | Status: DC | PRN

## 2015-01-15 MED ORDER — OXYTOCIN 40 UNITS IN LACTATED RINGERS INFUSION - SIMPLE MED: 62.5000 mL/h | INTRAVENOUS | Status: DC

## 2015-01-15 MED ORDER — FLEET ENEMA 7-19 GM/118ML RE ENEM: 1.0000 | ENEMA | RECTAL | Status: DC | PRN

## 2015-01-15 MED ORDER — LACTATED RINGERS IV SOLN
INTRAVENOUS | Status: DC
  Administered 2015-01-15: 08:00:00 via INTRAVENOUS

## 2015-01-15 MED ORDER — TETANUS-DIPHTH-ACELL PERTUSSIS 5-2.5-18.5 LF-MCG/0.5 IM SUSP: 0.5000 mL | Freq: Once | INTRAMUSCULAR | Status: DC

## 2015-01-15 MED ORDER — LANOLIN HYDROUS EX OINT: TOPICAL_OINTMENT | CUTANEOUS | Status: DC | PRN

## 2015-01-15 MED ORDER — PRENATAL MULTIVITAMIN CH
1.0000 | ORAL_TABLET | Freq: Every day | ORAL | Status: DC
  Administered 2015-01-15 – 2015-01-17 (×3): 1 via ORAL
  Filled 2015-01-15 (×3): qty 1

## 2015-01-15 MED ORDER — OXYCODONE-ACETAMINOPHEN 5-325 MG PO TABS: 2.0000 | ORAL_TABLET | ORAL | Status: DC | PRN

## 2015-01-15 MED ORDER — OXYCODONE-ACETAMINOPHEN 5-325 MG PO TABS
1.0000 | ORAL_TABLET | ORAL | Status: DC | PRN
  Administered 2015-01-15 – 2015-01-17 (×4): 1 via ORAL
  Filled 2015-01-15 (×4): qty 1

## 2015-01-15 MED ORDER — DIPHENHYDRAMINE HCL 25 MG PO CAPS: 25.0000 mg | ORAL_CAPSULE | Freq: Four times a day (QID) | ORAL | Status: DC | PRN

## 2015-01-15 MED ORDER — SENNOSIDES-DOCUSATE SODIUM 8.6-50 MG PO TABS
2.0000 | ORAL_TABLET | ORAL | Status: DC
  Administered 2015-01-16 (×2): 2 via ORAL
  Filled 2015-01-15 (×2): qty 2

## 2015-01-15 MED ORDER — SIMETHICONE 80 MG PO CHEW
80.0000 mg | CHEWABLE_TABLET | ORAL | Status: DC | PRN
  Administered 2015-01-16: 80 mg via ORAL
  Filled 2015-01-15: qty 1

## 2015-01-15 MED ORDER — BENZOCAINE-MENTHOL 20-0.5 % EX AERO
1.0000 "application " | INHALATION_SPRAY | CUTANEOUS | Status: DC | PRN
  Administered 2015-01-15 – 2015-01-16 (×2): 1 via TOPICAL
  Filled 2015-01-15 (×2): qty 56

## 2015-01-15 MED ORDER — OXYTOCIN 40 UNITS IN LACTATED RINGERS INFUSION - SIMPLE MED
1.0000 m[IU]/min | INTRAVENOUS | Status: DC
  Administered 2015-01-15: 2 m[IU]/min via INTRAVENOUS
  Filled 2015-01-15: qty 1000

## 2015-01-15 MED ORDER — ONDANSETRON HCL 4 MG/2ML IJ SOLN: 4.0000 mg | Freq: Four times a day (QID) | INTRAMUSCULAR | Status: DC | PRN

## 2015-01-15 MED ORDER — FLEET ENEMA 7-19 GM/118ML RE ENEM: 1.0000 | ENEMA | Freq: Every day | RECTAL | Status: DC | PRN

## 2015-01-15 MED ORDER — PENICILLIN G POTASSIUM 5000000 UNITS IJ SOLR
5.0000 10*6.[IU] | Freq: Once | INTRAVENOUS | Status: AC
  Administered 2015-01-15: 5 10*6.[IU] via INTRAVENOUS
  Filled 2015-01-15: qty 5

## 2015-01-15 MED ORDER — DIPHENHYDRAMINE HCL 50 MG/ML IJ SOLN: 12.5000 mg | INTRAMUSCULAR | Status: DC | PRN

## 2015-01-15 MED ORDER — IBUPROFEN 600 MG PO TABS
600.0000 mg | ORAL_TABLET | Freq: Once | ORAL | Status: AC
  Administered 2015-01-15: 600 mg via ORAL
  Filled 2015-01-15: qty 1

## 2015-01-15 MED ORDER — WITCH HAZEL-GLYCERIN EX PADS: 1.0000 "application " | MEDICATED_PAD | CUTANEOUS | Status: DC | PRN

## 2015-01-15 MED ORDER — ONDANSETRON HCL 4 MG PO TABS: 4.0000 mg | ORAL_TABLET | ORAL | Status: DC | PRN

## 2015-01-15 MED ORDER — OXYCODONE-ACETAMINOPHEN 5-325 MG PO TABS
1.0000 | ORAL_TABLET | ORAL | Status: DC | PRN
  Administered 2015-01-15: 1 via ORAL
  Filled 2015-01-15: qty 1

## 2015-01-15 NOTE — Progress Notes (Signed)
Delivery Note At 12:07 PM a viable female was delivered via Vaginal, Spontaneous Delivery (Presentation: ;  ).  APGAR: 9, 10; weight 8 lb 2.9 oz (3711 g).   Placenta status: Intact, Spontaneous.  Cord: 3 vessels with the following complications: None.  Cord pH: N/A  Anesthesia: Local  Episiotomy: None Lacerations: 1st degree Suture Repair: 2.0 vicryl rapide Est. Blood Loss (mL): 114  Mom to postpartum.  Baby to Couplet care / Skin to Skin.  Milany Geck II,Aleyna Cueva E 01/15/2015, 2:48 PM

## 2015-01-15 NOTE — H&P (Signed)
Roberta Farley is a 29 y.o. female presenting for IOL with favorable cervix. Maternal Medical History:  Fetal activity: Perceived fetal activity is normal.      OB History    Gravida Para Term Preterm AB TAB SAB Ectopic Multiple Living   2              Past Medical History  Diagnosis Date  . Chlamydia IBS  . Asthma    Past Surgical History  Procedure Laterality Date  . Induced abortion      x 2   Family History: family history includes Cancer in her maternal grandmother; Congestive Heart Failure in an other family member; Diabetes in her father, mother, and another family member; Hyperlipidemia in her father; Hypertension in her mother and another family member; Ovarian cancer in her maternal grandmother. Social History:  reports that she quit smoking about 19 months ago. She has never used smokeless tobacco. She reports that she drinks alcohol. She reports that she does not use illicit drugs.   Prenatal Transfer Tool  Maternal Diabetes: No Genetic Screening: Normal Maternal Ultrasounds/Referrals: Normal Fetal Ultrasounds or other Referrals:  None Maternal Substance Abuse:  No Significant Maternal Medications:  None Significant Maternal Lab Results:  None Other Comments:  None  Review of Systems  Eyes: Negative for blurred vision.  Gastrointestinal: Negative for abdominal pain.  Neurological: Negative for headaches.    Dilation: 4 Effacement (%): 50 Station: -1 Exam by:: Shafiq Larch Blood pressure 115/66, pulse 88, resp. rate 16, height 5' 8.5" (1.74 m), weight 187 lb (84.823 kg), last menstrual period 04/04/2014. Maternal Exam:  Abdomen: Fetal presentation: vertex     Fetal Exam Fetal State Assessment: Category I - tracings are normal.     Physical Exam  Cardiovascular: Normal rate and regular rhythm.   Respiratory: Effort normal and breath sounds normal.  GI: Soft. There is no tenderness.  Neurological: She has normal reflexes.     AROM-clear   Prenatal labs: ABO, Rh: --/--/O POS (11/08 0525) Antibody:   Rubella:   RPR:    HBsAg:    HIV: NONREACTIVE (06/19 0748)  GBS: Positive (06/01 0000)   Assessment/Plan: 29 yo G3P1 @ 40 6/7 weeks D/W patient induction D/W pitocin in 1-2 hours if not in labor All questions answered Patient states she understands and agrees   Lahela Woodin II,Malin Sambrano E 01/15/2015, 7:48 AM

## 2015-01-16 ENCOUNTER — Encounter (HOSPITAL_COMMUNITY): Payer: Self-pay

## 2015-01-16 LAB — CBC
HCT: 33.9 % — ABNORMAL LOW (ref 36.0–46.0)
Hemoglobin: 11.5 g/dL — ABNORMAL LOW (ref 12.0–15.0)
MCH: 31.9 pg (ref 26.0–34.0)
MCHC: 33.9 g/dL (ref 30.0–36.0)
MCV: 94.2 fL (ref 78.0–100.0)
PLATELETS: 205 10*3/uL (ref 150–400)
RBC: 3.6 MIL/uL — ABNORMAL LOW (ref 3.87–5.11)
RDW: 13 % (ref 11.5–15.5)
WBC: 12.5 10*3/uL — ABNORMAL HIGH (ref 4.0–10.5)

## 2015-01-16 NOTE — Progress Notes (Signed)
Post Partum Day 1 Subjective: up ad lib, voiding, tolerating PO and complains of uterine cramping  Objective: Blood pressure 98/55, pulse 73, temperature 97.9 F (36.6 C), temperature source Oral, resp. rate 18, height 5' 8.5" (1.74 m), weight 187 lb (84.823 kg), last menstrual period 04/04/2014, SpO2 100 %, unknown if currently breastfeeding.  Physical Exam:  General: alert and cooperative Lochia: appropriate Uterine Fundus: firm Incision: healing well DVT Evaluation: No evidence of DVT seen on physical exam. Negative Homan's sign. No cords or calf tenderness. No significant calf/ankle edema.   Recent Labs  01/15/15 0740 01/16/15 0520  HGB 13.4 11.5*  HCT 37.4 33.9*    Assessment/Plan: Plan for discharge tomorrow and Circumcision prior to discharge   LOS: 1 day   CURTIS,CAROL G 01/16/2015, 8:11 AM

## 2015-01-16 NOTE — Lactation Note (Addendum)
This note was copied from the chart of Roberta Farley. 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  Mom has everted nipples, hand expression taught w/colostrum. Mom has short shaft everted nipples that evert more w/stimulation. Stressed importance of deep latch. Referred to Baby and Me Book in Breastfeeding section Pg. 22-23 for position options and Proper latch demonstration. Mom encouraged to feed baby 8-12 times/24 hours and with feeding cues. Mom encouraged to waken baby for feeds. Educated about newborn behavior. Mom encouraged to do skin-to-skin.Mom reports + breast changes w/pregnancy.  Stressed importance of supply and demand and I&O. WH/LC brochure given w/resources, support groups and LC services. Patient Name: Roberta Larue Defaria [id number] Date: 01/16/2015 Reason for consult: Initial assessment   Maternal Data Has patient been taught Hand Expression?: Yes Does the patient have breastfeeding experience prior to this delivery?: Yes  Feeding    LATCH Score/Interventions Latch: Too sleepy or reluctant, no latch achieved, no sucking elicited. Intervention(s): Waking techniques;Teach feeding cues     Type of Nipple: Everted at rest and after stimulation  Comfort (Breast/Nipple): Soft / non-tender     Intervention(s): Breastfeeding basics reviewed;Support Pillows;Position options;Skin to skin     Lactation Tools Discussed/Used     Consult Status Consult Status: Follow-up Date: 01/17/15 Follow-up type: In-patient    Charyl Dancer 01/16/2015, 5:40 AM

## 2015-01-16 NOTE — Lactation Note (Signed)
This note was copied from the chart of Boy Sheilagh Rossler. Lactation Consultation Note  Patient Name: Boy Briseyda Delosangeles [id number] Date: 01/16/2015 Reason for consult: Follow-up assessment   With this mom of a term baby, now 55 hours old. Mom concerned that baby wants to "suck a lot" baby was in crib showing strong feeding cues. I explained that hs is cluster feeding, and how this is normal and will bring her milk in. I explained that between 48-72 hours her milk will come in, and the baby will not need to feed as often. I asisted mom with latching in cross cradle, and advised she maintain cross cradle as opposed to cradle, until the baby is able to latch independently. Mom using football on her left breast, and will call for me to observe this position, when she and baby ready.    Maternal Data    Feeding Feeding Type: Breast Fed Length of feed: 50 min  LATCH Score/Interventions Latch: Grasps breast easily, tongue down, lips flanged, rhythmical sucking.  Audible Swallowing: A few with stimulation  Type of Nipple: Everted at rest and after stimulation  Comfort (Breast/Nipple): Soft / non-tender     Hold (Positioning): Assistance needed to correctly position infant at breast and maintain latch. Intervention(s): Breastfeeding basics reviewed;Support Pillows;Position options  LATCH Score: 8  Lactation Tools Discussed/Used     Consult Status Consult Status: Follow-up Date: 01/17/15 Follow-up type: In-patient    Alfred Levins 01/16/2015, 10:37 AM

## 2015-01-17 MED ORDER — IBUPROFEN 600 MG PO TABS: 600.0000 mg | ORAL_TABLET | Freq: Four times a day (QID) | ORAL | Status: DC

## 2015-01-17 MED ORDER — OXYCODONE-ACETAMINOPHEN 5-325 MG PO TABS: 1.0000 | ORAL_TABLET | ORAL | Status: DC | PRN

## 2015-01-17 NOTE — Progress Notes (Signed)
Post Partum Day 2 Subjective: up ad lib, voiding, tolerating PO, + flatus and does complain of slight sore throat. no cough and no fever. child at home not ill  Objective: Blood pressure 103/55, pulse 71, temperature 98.6 F (37 C), temperature source Oral, resp. rate 18, height 5' 8.5" (1.74 m), weight 187 lb (84.823 kg), last menstrual period 04/04/2014, SpO2 99 %, unknown if currently breastfeeding.  Physical Exam:  General: alert and cooperative Lochia: appropriate Uterine Fundus: firm Incision: healing well DVT Evaluation: No evidence of DVT seen on physical exam. Negative Homan's sign. No cords or calf tenderness. No significant calf/ankle edema.   Recent Labs  01/15/15 0740 01/16/15 0520  HGB 13.4 11.5*  HCT 37.4 33.9*  Throat not erythematous. No exudate noted. No cervical lymphadenopathy  Assessment/Plan: Discharge home.  Recommend Cepacol lozenges or salt water gargles   LOS: 2 days   CURTIS,CAROL G 01/17/2015, 8:00 AM

## 2015-01-17 NOTE — Lactation Note (Signed)
This note was copied from the chart of Roberta Farley. Lactation Consultation Note Baby had a 7 % weight loss in 24 hrs. Has good feedings. I feel the 7 % weight loss 12 stools and 7 voids. I feel this is the cause of the weight loss. Patient Name: Roberta Christl Alcaraz [id number] Date: 01/17/2015     Maternal Data    Feeding Feeding Type: Breast Fed Length of feed: 45 min  LATCH Score/Interventions Latch: Repeated attempts needed to sustain latch, nipple held in mouth throughout feeding, stimulation needed to elicit sucking reflex.  Audible Swallowing: A few with stimulation  Type of Nipple: Everted at rest and after stimulation  Comfort (Breast/Nipple): Soft / non-tender     Hold (Positioning): No assistance needed to correctly position infant at breast.  LATCH Score: 8  Lactation Tools Discussed/Used     Consult Status      Mildred Bollard, Diamond Nickel 01/17/2015, 2:57 AM

## 2015-01-17 NOTE — Discharge Summary (Signed)
Obstetric Discharge Summary Reason for Admission: induction of labor Prenatal Procedures: ultrasound Intrapartum Procedures: spontaneous vaginal delivery Postpartum Procedures: none Complications-Operative and Postpartum: 1 degree perineal laceration HEMOGLOBIN  Date Value Ref Range Status  01/16/2015 11.5* 12.0 - 15.0 g/dL Final   HCT  Date Value Ref Range Status  01/16/2015 33.9* 36.0 - 46.0 % Final    Physical Exam:  General: alert and cooperative Lochia: appropriate Uterine Fundus: firm Incision: healing well DVT Evaluation: No evidence of DVT seen on physical exam. Negative Homan's sign. No cords or calf tenderness. No significant calf/ankle edema.  Discharge Diagnoses: Term Pregnancy-delivered  Discharge Information: Date: 01/17/2015 Activity: pelvic rest Diet: routine Medications: PNV, Ibuprofen and Percocet Condition: stable Instructions: refer to practice specific booklet Discharge to: home   Newborn Data: Live born female  Birth Weight: 8 lb 2.9 oz (3711 g) APGAR: 9, 10  Home with mother.  CURTIS,CAROL G 01/17/2015, 8:05 AM

## 2015-01-17 NOTE — Progress Notes (Signed)
Discharge teaching complete. Pt understood all instructions and did not have any questions. Pt ambulated out of the hospital and discharged home to family.  

## 2015-11-06 ENCOUNTER — Encounter: Payer: Self-pay | Admitting: Medical

## 2015-11-06 ENCOUNTER — Ambulatory Visit (INDEPENDENT_AMBULATORY_CARE_PROVIDER_SITE_OTHER): Payer: Managed Care, Other (non HMO) | Admitting: Medical

## 2015-11-06 VITALS — BP 120/70 | HR 111 | Temp 100.3°F | Ht 68.5 in | Wt 176.6 lb

## 2015-11-06 DIAGNOSIS — J0100 Acute maxillary sinusitis, unspecified: Secondary | ICD-10-CM

## 2015-11-06 DIAGNOSIS — J111 Influenza due to unidentified influenza virus with other respiratory manifestations: Secondary | ICD-10-CM

## 2015-11-06 DIAGNOSIS — R05 Cough: Secondary | ICD-10-CM | POA: Diagnosis not present

## 2015-11-06 DIAGNOSIS — R059 Cough, unspecified: Secondary | ICD-10-CM

## 2015-11-06 DIAGNOSIS — R062 Wheezing: Secondary | ICD-10-CM

## 2015-11-06 LAB — POC INFLUENZA A&B (BINAX/QUICKVUE)
Influenza A, POC: NEGATIVE
Influenza B, POC: NEGATIVE

## 2015-11-06 MED ORDER — PREDNISONE 20 MG PO TABS: ORAL_TABLET | ORAL | Status: DC

## 2015-11-06 MED ORDER — OSELTAMIVIR PHOSPHATE 75 MG PO CAPS: 75.0000 mg | ORAL_CAPSULE | Freq: Two times a day (BID) | ORAL | Status: DC

## 2015-11-06 MED ORDER — AZITHROMYCIN 250 MG PO TABS: ORAL_TABLET | ORAL | Status: DC

## 2015-11-06 MED ORDER — BENZONATATE 100 MG PO CAPS: 100.0000 mg | ORAL_CAPSULE | Freq: Three times a day (TID) | ORAL | Status: DC | PRN

## 2015-11-06 NOTE — Progress Notes (Signed)
Subjective:    Patient ID: Roberta Farley, female    DOB: 12-22-1985, 30 y.o.   MRN: [id number]  HPI  Pt in with onset of fever, chills, bodyaches, fatigued, and some ha over last day. Pt states diffuse body aches.   Pt is coughing a lot.   She is bringing up some mucous.  LMP- last week.  Used neb treatment twice today.  Some wheezing today.    Review of Systems  Constitutional: Positive for fever, chills and fatigue.  HENT: Positive for congestion and sinus pressure.   Respiratory: Positive for cough. Negative for chest tightness, shortness of breath and wheezing.   Cardiovascular: Negative for chest pain.  Gastrointestinal: Negative for abdominal pain.  Musculoskeletal: Positive for myalgias.  Skin: Negative for rash.  Psychiatric/Behavioral: Negative for behavioral problems and confusion.    Past Medical History  Diagnosis Date  . Chlamydia IBS  . Asthma     Social History   Social History  . Marital Status: Single    Spouse Name: N/A  . Number of Children: 1  . Years of Education: N/A   Occupational History  . CNA    Social History Main Topics  . Smoking status: Former Smoker    Quit date: 06/11/2013  . Smokeless tobacco: Never Used     Comment: "Hookah" tobacco    . Alcohol Use: Yes     Comment: "socially"  . Drug Use: No  . Sexual Activity:    Partners: Male    Birth Control/ Protection: Injection, None   Other Topics Concern  . Not on file   Social History Narrative    Past Surgical History  Procedure Laterality Date  . Induced abortion      x 2  . No past surgeries      Family History  Problem Relation Age of Onset  . Hypertension    . Congestive Heart Failure    . Diabetes    . Ovarian cancer Maternal Grandmother   . Cancer Maternal Grandmother   . Diabetes Mother   . Hypertension Mother   . Hyperlipidemia Father   . Diabetes Father     Allergies  Allergen Reactions  . Other Rash    Strawberry/kiwi flavor only,   Not allergic to actual fruits    Current Outpatient Prescriptions on File Prior to Visit  Medication Sig Dispense Refill  . albuterol (PROVENTIL HFA;VENTOLIN HFA) 108 (90 BASE) MCG/ACT inhaler Inhale 1-2 puffs into the lungs every 6 (six) hours as needed for wheezing or shortness of breath. 1 Inhaler 0  . ibuprofen (ADVIL,MOTRIN) 600 MG tablet Take 1 tablet (600 mg total) by mouth every 6 (six) hours. 30 tablet 1  . oxyCODONE-acetaminophen (PERCOCET/ROXICET) 5-325 MG per tablet Take 1-2 tablets by mouth every 4 (four) hours as needed (for pain scale greater than 7). 30 tablet 0  . Prenatal Vit-Fe Fumarate-FA (PRENATAL MULTIVITAMIN) TABS tablet Take 1 tablet by mouth daily at 12 noon.     No current facility-administered medications on file prior to visit.    BP 120/70 mmHg  Pulse 111  Temp(Src) 100.3 F (37.9 C) (Oral)  Ht 5' 8.5" (1.74 m)  Wt 176 lb 9.6 oz (80.105 kg)  BMI 26.46 kg/m2  SpO2 98%       Objective:   Physical Exam  General  Mental Status - Alert. General Appearance - Well groomed. Not in acute distress. But appears fatigued.  Skin Rashes- No Rashes.  HEENT Head- Normal. Ear Auditory  Canal - Left- Normal. Right - Normal.Tympanic Membrane- Left- Normal. Right- Normal. Eye Sclera/Conjunctiva- Left- Normal. Right- Normal. Nose & Sinuses Nasal Mucosa- Left-  Boggy and Congested. Right-  Boggy and  Congested.Bilateral maxillary but no  frontal sinus pressure. Mouth & Throat Lips: Upper Lip- Normal: no dryness, cracking, pallor, cyanosis, or vesicular eruption. Lower Lip-Normal: no dryness, cracking, pallor, cyanosis or vesicular eruption. Buccal Mucosa- Bilateral- No Aphthous ulcers. Oropharynx- No Discharge or Erythema. Tonsils: Characteristics- Bilateral- No Erythema or Congestion. Size/Enlargement- Bilateral- No enlargement. Discharge- bilateral-None.  Neck Neck- Supple. No Masses.   Chest and Lung Exam Auscultation: Breath Sounds:-Clear even and  unlabored. But mild shallow  Cardiovascular Auscultation:Rythm- Regular, rate and rhythm. Murmurs & Other Heart Sounds:Ausculatation of the heart reveal- No Murmurs.  Lymphatic Head & Neck General Head & Neck Lymphatics: Bilateral: Description- No Localized lymphadenopathy.       Assessment & Plan:  For flu syndrome rx tamiflu. Test is negative but may be false negative. Treating clinically.  Use ibuprofen for body aches.  For cough rx benzonatate.  For early sinus infection and bronchitis rx azithromycin.  For wheezing continue inhaler. If you feel like worsening then would add taper dose of prednisone. Let us know before the weekend if you feel worse.  Follow up in 7 days or a needed.

## 2015-11-06 NOTE — Patient Instructions (Addendum)
For flu syndrome rx tamiflu. Test is negative but may be false negative. Treating clinically.  Use ibuprofen for body aches.  For cough rx benzonatate.  For early sinus infection and bronchitis rx azithromycin.  For wheezing continue inhaler. If you feel like worsening then would add taper dose of prednisone. Let us know before the weekend if you feel worse.  Follow up in 7 days or a needed.

## 2015-11-06 NOTE — Progress Notes (Signed)
Pre visit review using our clinic review tool, if applicable. No additional management support is needed unless otherwise documented below in the visit note. 

## 2015-11-08 ENCOUNTER — Other Ambulatory Visit: Payer: Self-pay

## 2015-11-08 ENCOUNTER — Emergency Department: Payer: Managed Care, Other (non HMO)

## 2015-11-08 ENCOUNTER — Emergency Department
Admission: EM | Admit: 2015-11-08 | Discharge: 2015-11-08 | Disposition: A | Discharge status: Home / Self Care | Payer: Managed Care, Other (non HMO) | Attending: Emergency Medicine | Admitting: Emergency Medicine

## 2015-11-08 DIAGNOSIS — E860 Dehydration: Secondary | ICD-10-CM

## 2015-11-08 DIAGNOSIS — Z79899 Other long term (current) drug therapy: Secondary | ICD-10-CM | POA: Insufficient documentation

## 2015-11-08 DIAGNOSIS — Z87891 Personal history of nicotine dependence: Secondary | ICD-10-CM | POA: Diagnosis not present

## 2015-11-08 DIAGNOSIS — R0602 Shortness of breath: Secondary | ICD-10-CM

## 2015-11-08 DIAGNOSIS — B349 Viral infection, unspecified: Secondary | ICD-10-CM | POA: Diagnosis not present

## 2015-11-08 DIAGNOSIS — J45909 Unspecified asthma, uncomplicated: Secondary | ICD-10-CM | POA: Diagnosis not present

## 2015-11-08 LAB — FIBRIN DERIVATIVES D-DIMER (ARMC ONLY): FIBRIN DERIVATIVES D-DIMER (ARMC): 335 (ref 0–499)

## 2015-11-08 LAB — BASIC METABOLIC PANEL
ANION GAP: 8 (ref 5–15)
BUN: 7 mg/dL (ref 6–20)
CHLORIDE: 106 mmol/L (ref 101–111)
CO2: 24 mmol/L (ref 22–32)
Calcium: 9.5 mg/dL (ref 8.9–10.3)
Creatinine, Ser: 0.79 mg/dL (ref 0.44–1.00)
Glucose, Bld: 132 mg/dL — ABNORMAL HIGH (ref 65–99)
POTASSIUM: 3.9 mmol/L (ref 3.5–5.1)
SODIUM: 138 mmol/L (ref 135–145)

## 2015-11-08 LAB — CBC
HEMATOCRIT: 39.4 % (ref 35.0–47.0)
HEMOGLOBIN: 13.3 g/dL (ref 12.0–16.0)
MCH: 32.7 pg (ref 26.0–34.0)
MCHC: 33.9 g/dL (ref 32.0–36.0)
MCV: 96.6 fL (ref 80.0–100.0)
Platelets: 192 10*3/uL (ref 150–440)
RBC: 4.08 MIL/uL (ref 3.80–5.20)
RDW: 12.7 % (ref 11.5–14.5)
WBC: 7.4 10*3/uL (ref 3.6–11.0)

## 2015-11-08 LAB — TROPONIN I: Troponin I: <0.03 ng/mL (ref <0.031)

## 2015-11-08 LAB — TSH: TSH: 0.234 u[IU]/mL — AB (ref 0.350–4.500)

## 2015-11-08 MED ORDER — SODIUM CHLORIDE 0.9 % IV BOLUS (SEPSIS)
1000.0000 mL | Freq: Once | INTRAVENOUS | Status: AC
  Administered 2015-11-08: 1000 mL via INTRAVENOUS

## 2015-11-08 MED ORDER — DIAZEPAM 2 MG PO TABS
2.0000 mg | ORAL_TABLET | Freq: Once | ORAL | Status: AC
Start: 2015-11-08 — End: 2015-11-08
  Administered 2015-11-08: 2 mg via ORAL
  Filled 2015-11-08: qty 1

## 2015-11-08 MED ORDER — IPRATROPIUM-ALBUTEROL 0.5-2.5 (3) MG/3ML IN SOLN
3.0000 mL | Freq: Once | RESPIRATORY_TRACT | Status: AC
  Administered 2015-11-08: 3 mL via RESPIRATORY_TRACT
  Filled 2015-11-08: qty 3

## 2015-11-08 NOTE — ED Provider Notes (Signed)
[facility] Emergency Department Provider Note  ____________________________________________  Time seen: Approximately 2:42 PM  I have reviewed the triage vital signs and the nursing notes.   HISTORY  Chief Complaint Shortness of Breath    HPI Roberta Farley is a 30 y.o. female , NAD, presents to the emergency department with worsening shortness of breath since Wednesday. States she was seen by her primary care provider on Thursday and diagnosed with influenza. Was placed on Tamiflu, azithromycin, benzonatate, prednisone. Patient has also been using her rescue albuterol inhaler as well as using albuterol nebulized solution at home due to shortness of breath and chest congestion. Has had no relief in her symptoms using his medications. All her primary care provider today who urged her to come to the emergency department for further evaluation. Patient states she continues to have fevers and chills. Denies any nasal congestion, ear pain, sore throat. Has had some production to her cough. Denies any chest pain but does note tightness. Has had mid lower back pain that worsens with cough. Not had dizziness, LOC, headaches, visual changes. Has not had any numbness, weakness, tingling.   Past Medical History  Diagnosis Date  . Chlamydia IBS  . Asthma     Patient Active Problem List   Diagnosis Date Noted  . Term pregnancy 01/15/2015  . Pregnant 05/08/2014  . Back pain 05/08/2014  . Sepsis (HCC) 01/16/2014  . Pyelonephritis 01/15/2014  . Acute sinusitis 07/14/2013  . Encounter to establish care 06/11/2013  . Mild dysplasia of cervix 01/25/2013  . BV (bacterial vaginosis) 01/25/2013  . Papanicolaou smear of cervix with low grade squamous intraepithelial lesion (LGSIL) 01/15/2013  . Vaginitis and vulvovaginitis, unspecified 11/29/2012  . WHEEZING 11/19/2008  . IBS 09/08/2007  . ANEMIA-IRON DEFICIENCY 08/28/2007  . ASTHMA, INTERMITTENT, MILD 08/28/2007  .  AMENORRHEA 08/28/2007  . SCOLIOSIS, THORACIC SPINE 08/28/2007  . HEADACHE 08/28/2007  . DIARRHEA 08/28/2007    Past Surgical History  Procedure Laterality Date  . Induced abortion      x 2  . No past surgeries      Current Outpatient Rx  Name  Route  Sig  Dispense  Refill  . albuterol (PROVENTIL HFA;VENTOLIN HFA) 108 (90 BASE) MCG/ACT inhaler   Inhalation   Inhale 1-2 puffs into the lungs every 6 (six) hours as needed for wheezing or shortness of breath.   1 Inhaler   0   . azithromycin (ZITHROMAX) 250 MG tablet      Take 2 tablets by mouth on day 1, followed by 1 tablet by mouth daily for 4 days.   6 tablet   0   . benzonatate (TESSALON) 100 MG capsule   Oral   Take 1 capsule (100 mg total) by mouth 3 (three) times daily as needed.   21 capsule   0   . ibuprofen (ADVIL,MOTRIN) 600 MG tablet   Oral   Take 1 tablet (600 mg total) by mouth every 6 (six) hours.   30 tablet   1   . oseltamivir (TAMIFLU) 75 MG capsule   Oral   Take 1 capsule (75 mg total) by mouth 2 (two) times daily.   10 capsule   0   . oxyCODONE-acetaminophen (PERCOCET/ROXICET) 5-325 MG per tablet   Oral   Take 1-2 tablets by mouth every 4 (four) hours as needed (for pain scale greater than 7).   30 tablet   0   . predniSONE (DELTASONE) 20 MG tablet  3 tabs po day 1, 2 tab po day 2, 1 tab po day 3.   6 tablet   0   . Prenatal Vit-Fe Fumarate-FA (PRENATAL MULTIVITAMIN) TABS tablet   Oral   Take 1 tablet by mouth daily at 12 noon.           Allergies Other  Family History  Problem Relation Age of Onset  . Hypertension    . Congestive Heart Failure    . Diabetes    . Ovarian cancer Maternal Grandmother   . Cancer Maternal Grandmother   . Diabetes Mother   . Hypertension Mother   . Hyperlipidemia Father   . Diabetes Father     Social History Social History  Substance Use Topics  . Smoking status: Former Smoker    Quit date: 06/11/2013  . Smokeless tobacco: Never Used      Comment: "Hookah" tobacco    . Alcohol Use: Yes     Comment: "socially"     Review of Systems  Constitutional: Positive fever, chills, sweats, fatigue.  Eyes: No visual changes. No discharge, redness, swelling ENT: No sore throat, nasal congestion, runny nose, sinus pressure. Cardiovascular: Positive chest tightness but no specific chest pain. Respiratory: Positive chest congestion, shortness of breath, wheezing, productive cough cough. Gastrointestinal: No abdominal pain.  No nausea, vomiting.  No diarrhea. Musculoskeletal: Positive for lower back pain.  Skin: Negative for rash, skin sores, redness, bruising. Neurological: Negative for headaches, focal weakness or numbness. No tingling. No LOC, dizziness 10-point ROS otherwise negative.  ____________________________________________   PHYSICAL EXAM:  VITAL SIGNS: ED Triage Vitals  Enc Vitals Group     BP 11/08/15 1252 131/76 mmHg     Pulse Rate 11/08/15 1252 115     Resp 11/08/15 1252 20     Temp 11/08/15 1252 98.1 F (36.7 C)     Temp Source 11/08/15 1252 Oral     SpO2 11/08/15 1252 99 %     Weight 11/08/15 1252 175 lb (79.379 kg)     Height 11/08/15 1252 5\' 8"  (1.727 m)     Head Cir --      Peak Flow --      Pain Score 11/08/15 1256 5     Pain Loc --      Pain Edu? --      Excl. in GC? --     Constitutional: Alert and oriented. Ill appearing but in no acute distress. Eyes: Conjunctivae are normal. PERRL. EOMI without pain.  Head: Atraumatic. ENT:      Nose: No congestion/rhinnorhea.      Mouth/Throat: Mucous membranes are moist.  Neck: No stridor. Supple with full range of motion Hematological/Lymphatic/Immunilogical: No cervical lymphadenopathy. Cardiovascular: Normal rate, regular rhythm. Normal S1 and S2.  No murmurs, rubs, gallops. Good peripheral circulation. Respiratory: Increased respiratory effort with mild tachypnea but no retractions. Lungs CTAB with breath sounds heard throughout all lung  fields. Musculoskeletal: No lower extremity tenderness nor edema.  No joint effusions. Neurologic:  Normal speech and language. No gross focal neurologic deficits are appreciated.  Skin:  Skin is warm, dry and intact. No rash noted. Psychiatric: Mood and affect are normal. Speech and behavior are normal. Patient exhibits appropriate insight and judgement.   ____________________________________________   LABS (all labs ordered are listed, but only abnormal results are displayed)  Labs Reviewed  BASIC METABOLIC PANEL - Abnormal; Notable for the following:    Glucose, Bld 132 (*)    All other components within normal  limits  CBC  TROPONIN I  FIBRIN DERIVATIVES D-DIMER ([facility] ONLY)  TSH   ____________________________________________  EKG  EKG was sinus tachycardia with a ventricular rate of 107 bpm. No acute changes nor evidence of STEMI. EKG was also reviewed by Dr. Phineas Semen. ____________________________________________  RADIOLOGY I have personally viewed and evaluated these images (plain radiographs) as part of my medical decision making, as well as reviewing the written report by the radiologist.  Dg Chest 2 View  11/08/2015  CLINICAL DATA:  30 year old female with a history of cough and weakness EXAM: CHEST - 2 VIEW COMPARISON:  02/22/2014 FINDINGS: Cardiomediastinal silhouette projects within normal limits in size and contour. No confluent airspace disease, pneumothorax, or pleural effusion. Similar appearance of scoliotic curvature of the thoracic spine. Unremarkable appearance of the upper abdomen. IMPRESSION: No radiographic evidence of acute cardiopulmonary disease. Signed, Yvone Neu. Loreta Ave, DO [facility] Radiology Electronically Signed   By: Gilmer Mor D.O.   On: 11/08/2015 14:08    ____________________________________________    PROCEDURES  Procedure(s) performed: None    Medications  ipratropium-albuterol  (DUONEB) 0.5-2.5 (3) MG/3ML nebulizer solution 3 mL (3 mLs Nebulization Given 11/08/15 1306)  sodium chloride 0.9 % bolus 1,000 mL (0 mLs Intravenous Stopped 11/08/15 1628)  diazepam (VALIUM) tablet 2 mg (2 mg Oral Given 11/08/15 1646)    ----------------------------------------- 3:58 PM on 11/08/2015 -----------------------------------------  I entered the room to the patient sleeping comfortably in the exam bed. Patient was woken easily to report feeling some better since administration of IV fluids has begun. She visually appears better and is without tachypnea. Patient has episodes of increased respiratory rate and has seemed slightly anxious through her visit. Discussed possibility of giving the patient Valium to assist in calming her mood and respirations in which she is agreeable to try. Will give low-dose Valium and recheck the patient in approximately 30 minutes. ____________________________________________   ----------------------------------------- 5:08 PM on 11/08/2015 -----------------------------------------  Patient is doing well with normal respirations and normal heart rate at this time. She has done well with the Valium without any side effects. Patient will be discharged home to continue medications as prescribed by her primary care provider.   INITIAL IMPRESSION / ASSESSMENT AND PLAN / ED COURSE  ----------------------------------------- 3:50 PM on 11/08/2015 -----------------------------------------  Patient's presentation and current ED course was discussed with Dr. Daryel November. Considering laboratory evaluation has been negative along with negative chest x-ray will give the patient option of trying low-dose Valium to see if any improvement in shortness of breath and appearance of anxiety. At this time the patient's physical exam is reassuring as is without respiratory distress or abnormalities. Patient is currently receiving IV fluids and I will evaluate her again  prior to administering Valium.  Pertinent labs & imaging results that were available during my care of the patient were reviewed by me and considered in my medical decision making (see chart for details).  Patient's diagnosis is consistent with shortness of breath due to viral infection causing dehydration. Patient will be discharged home with instructions to continue adequate oral hydration and to continue all medications as previously prescribed by her primary care physician. Patient is to follow up with primary care provider on Monday for recheck. Patient is given ED precautions to return to the ED for any worsening or new symptoms.    ____________________________________________  FINAL CLINICAL IMPRESSION(S) / ED DIAGNOSES  Final diagnoses:  Dehydration  Shortness of breath  Viral infection  NEW MEDICATIONS STARTED DURING THIS VISIT:  New Prescriptions   No medications on file       \  Hope Pigeon, PA-C 11/08/15 1710  Emily Filbert, MD 11/08/15 [phone]

## 2015-11-08 NOTE — ED Notes (Signed)
Pt reports being seen at her PCP on Thursday for questionable Flu. Pt reports continued cough and wheezing. Productive yellow thick sputum  Sweats and chills. Pt state she has been doing her Nebulizer treatments with at home with minimal relief.

## 2015-11-08 NOTE — ED Notes (Signed)
Patient states that she has been having shortness of breath since Wednesday, has been using her inhaler and nebulizer more often. Patient has been using her nebulizer every 2 hours. Patient has a hx/o of asthma. Patient was seen by her PCP on Thursday and was given a Z-pak, prednisone, tamiflu, Tessalon. Patient feels like the shortness of breath has gotten worse since Thursday.

## 2015-11-08 NOTE — Discharge Instructions (Signed)
Dehydration, Adult Dehydration means your body does not have as much fluid or water as it needs. It happens when you take in less fluid than you lose. Your kidneys, brain, and heart will not work properly without the right amount of fluids.  Dehydration can range from mild to severe. It should be treated right away to help prevent it from becoming severe. HOME CARE  Drink enough fluid to keep your pee (urine) clear or pale yellow.  Drink water or fluid slowly by taking small sips. You can also try sucking on ice cubes.  Have food or drinks that contain electrolytes. Examples include bananas and sports drinks.  Take over-the-counter and prescription medicines only as told by your doctor.  Prepare oral rehydration solution (ORS) according to the instructions that came with it. Take sips of ORS every 5 minutes until your pee returns to normal.  If you are throwing up (vomiting) or have watery poop (diarrhea), keep trying to drink water, ORS, or both.  If you have watery poop, avoid:  Drinks with caffeine.  Fruit juice.  Milk.  Carbonated soft drinks.  Do not take salt tablets. This can lead to having too much sodium in your body (hypernatremia). GET HELP IF:  You cannot eat or drink without throwing up.  You have had mild watery poop for longer than 24 hours.  You have a fever. GET HELP RIGHT AWAY IF:   You have very strong thirst.  You have very bad watery poop.  You have not peed in 6-8 hours, or you have peed only a small amount of very dark pee.  You have shriveled skin.  You are dizzy, confused, or both.   This information is not intended to replace advice given to you by your health care provider. Make sure you discuss any questions you have with your health care provider.   Document Released: 05/15/2009 Document Revised: 04/09/2015 Document Reviewed: 12/04/2014 Elsevier Interactive Patient Education 2016 Elsevier Inc.  Rehydration, Adult Rehydration is the  replacement of body fluids lost during dehydration. Dehydration is an extreme loss of body fluids to the point of body function impairment. There are many ways extreme fluid loss can occur, including vomiting, diarrhea, or excess sweating. Recovering from dehydration requires replacing lost fluids, continuing to eat to maintain strength, and avoiding foods and beverages that may contribute to further fluid loss or may increase nausea. HOW TO REHYDRATE In most cases, rehydration involves the replacement of not only fluids but also carbohydrates and basic body salts. Rehydration with an oral rehydration solution is one way to replace essential nutrients lost through dehydration. An oral rehydration solution can be purchased at pharmacies, retail stores, and online. Premixed packets of powder that you combine with water to make a solution are also sold. You can prepare an oral rehydration solution at home by mixing the following ingredients together:    - tsp table salt.   tsp baking soda.   tsp salt substitute containing potassium chloride.  1 tablespoons sugar.  1 L (34 oz) of water. Be sure to use exact measurements. Including too much sugar can make diarrhea worse. Drink -1 cup (120-240 mL) of oral rehydration solution each time you have diarrhea or vomit. If drinking this amount makes your vomiting worse, try drinking smaller amounts more often. For example, drink 1-3 tsp every 5-10 minutes.  A general rule for staying hydrated is to drink 1-2 L of fluid per day. Talk to your caregiver about the specific amount you should  be drinking each day. Drink enough fluids to keep your urine clear or pale yellow. EATING WHEN DEHYDRATED Even if you have had severe sweating or you are having diarrhea, do not stop eating. Many healthy items in a normal diet are okay to continue eating while recovering from dehydration. The following tips can help you to lessen nausea when you eat:  Ask someone else to  prepare your food. Cooking smells may worsen nausea.  Eat in a well-ventilated room away from cooking smells.  Sit up when you eat. Avoid lying down until 1-2 hours after eating.  Eat small amounts when you eat.  Eat foods that are easy to digest. These include soft, well-cooked, or mashed foods. FOODS AND BEVERAGES TO AVOID Avoid eating or drinking the following foods and beverages that may increase nausea or further loss of fluid:   Fruit juices with a high sugar content, such as concentrated juices.  Alcohol.  Beverages containing caffeine.  Carbonated drinks. They may cause a lot of gas.  Foods that may cause a lot of gas, such as cabbage, broccoli, and beans.  Fatty, greasy, and fried foods.  Spicy, very salty, and very sweet foods or drinks.  Foods or drinks that are very hot or very cold. Consume food or drinks at or near room temperature.  Foods that need a lot of chewing, such as raw vegetables.  Foods that are sticky or hard to swallow, such as peanut butter.   This information is not intended to replace advice given to you by your health care provider. Make sure you discuss any questions you have with your health care provider.   Document Released: 10/11/2011 Document Revised: 04/12/2012 Document Reviewed: 10/11/2011 Elsevier Interactive Patient Education 2016 [employer]  Shortness of Breath Shortness of breath means you have trouble breathing. Shortness of breath needs medical care right away. HOME CARE   Do not smoke.  Avoid being around chemicals or things (paint fumes, dust) that may bother your breathing.  Rest as needed. Slowly begin your normal activities.  Only take medicines as told by your doctor.  Keep all doctor visits as told. GET HELP RIGHT AWAY IF:   Your shortness of breath gets worse.  You feel lightheaded, pass out (faint), or have a cough that is not helped by medicine.  You cough up blood.  You have pain with  breathing.  You have pain in your chest, arms, shoulders, or belly (abdomen).  You have a fever.  You cannot walk up stairs or exercise the way you normally do.  You do not get better in the time expected.  You have a hard time doing normal activities even with rest.  You have problems with your medicines.  You have any new symptoms. MAKE SURE YOU:  Understand these instructions.  Will watch your condition.  Will get help right away if you are not doing well or get worse.   This information is not intended to replace advice given to you by your health care provider. Make sure you discuss any questions you have with your health care provider.   Document Released: 01/05/2008 Document Revised: 07/24/2013 Document Reviewed: 10/04/2011 Elsevier Interactive Patient Education [employer] Elsevier Inc.

## 2015-11-10 ENCOUNTER — Ambulatory Visit (INDEPENDENT_AMBULATORY_CARE_PROVIDER_SITE_OTHER): Payer: Managed Care, Other (non HMO) | Admitting: Medical

## 2015-11-10 ENCOUNTER — Encounter: Payer: Self-pay | Admitting: Medical

## 2015-11-10 VITALS — BP 102/68 | HR 93 | Temp 97.9°F | Ht 68.5 in | Wt 175.2 lb

## 2015-11-10 DIAGNOSIS — J4521 Mild intermittent asthma with (acute) exacerbation: Secondary | ICD-10-CM | POA: Diagnosis not present

## 2015-11-10 DIAGNOSIS — H6692 Otitis media, unspecified, left ear: Secondary | ICD-10-CM | POA: Diagnosis not present

## 2015-11-10 DIAGNOSIS — J111 Influenza due to unidentified influenza virus with other respiratory manifestations: Secondary | ICD-10-CM

## 2015-11-10 MED ORDER — CEFDINIR 300 MG PO CAPS: 300.0000 mg | ORAL_CAPSULE | Freq: Two times a day (BID) | ORAL | Status: DC

## 2015-11-10 MED ORDER — PREDNISONE 20 MG PO TABS: ORAL_TABLET | ORAL | Status: DC

## 2015-11-10 MED ORDER — FLUTICASONE PROPIONATE HFA 110 MCG/ACT IN AERO: 2.0000 | INHALATION_SPRAY | Freq: Two times a day (BID) | RESPIRATORY_TRACT | Status: DC

## 2015-11-10 NOTE — Progress Notes (Signed)
Subjective:    Patient ID: Roberta Farley, female    DOB: 01-Sep-1985, 30 y.o.   MRN: [id number]  HPI  Pt in for follow up. She presented with flu like syndrome. I treated her clinically for tamiflu, zpack, benazonatate and prednisone. Despite this she states breathing got worse. She went to the ED and got iv fluids and breathing treatment. Since then she has gradually gotten better.   Pt states done with zpack. She is finishing up on the tamiflu. She is also finishing up on taper dose of prednisone.   Pt still feels some chest congestion with faint wheezing this am. But she does overall feel a lot better.  Pt has albuterol at home.  lmp- 2 weeks ago.  ED note and cxr reviewed.    Review of Systems  Constitutional: Negative for fever, chills and fatigue.  HENT: Negative for congestion, dental problem, ear discharge, nosebleeds, sinus pressure, sneezing and sore throat.   Respiratory: Positive for cough and wheezing. Negative for shortness of breath.   Cardiovascular: Negative for chest pain and palpitations.  Gastrointestinal: Negative for abdominal pain and anal bleeding.  Musculoskeletal: Negative for back pain.  Neurological: Negative for dizziness, seizures, syncope and weakness.  Hematological: Negative for adenopathy. Does not bruise/bleed easily.  Psychiatric/Behavioral: Negative for behavioral problems, confusion and self-injury. The patient is not nervous/anxious.     Past Medical History  Diagnosis Date  . Chlamydia IBS  . Asthma     Social History   Social History  . Marital Status: Single    Spouse Name: N/A  . Number of Children: 1  . Years of Education: N/A   Occupational History  . CNA    Social History Main Topics  . Smoking status: Former Smoker    Quit date: 06/11/2013  . Smokeless tobacco: Never Used     Comment: "Hookah" tobacco    . Alcohol Use: Yes     Comment: "socially"  . Drug Use: No  . Sexual Activity:    Partners: Male   Birth Control/ Protection: Injection, None   Other Topics Concern  . Not on file   Social History Narrative    Past Surgical History  Procedure Laterality Date  . Induced abortion      x 2  . No past surgeries      Family History  Problem Relation Age of Onset  . Hypertension    . Congestive Heart Failure    . Diabetes    . Ovarian cancer Maternal Grandmother   . Cancer Maternal Grandmother   . Diabetes Mother   . Hypertension Mother   . Hyperlipidemia Father   . Diabetes Father     Allergies  Allergen Reactions  . Other Rash    Strawberry/kiwi flavor only,  Not allergic to actual fruits    Current Outpatient Prescriptions on File Prior to Visit  Medication Sig Dispense Refill  . albuterol (PROVENTIL HFA;VENTOLIN HFA) 108 (90 BASE) MCG/ACT inhaler Inhale 1-2 puffs into the lungs every 6 (six) hours as needed for wheezing or shortness of breath. 1 Inhaler 0  . azithromycin (ZITHROMAX) 250 MG tablet Take 2 tablets by mouth on day 1, followed by 1 tablet by mouth daily for 4 days. 6 tablet 0  . benzonatate (TESSALON) 100 MG capsule Take 1 capsule (100 mg total) by mouth 3 (three) times daily as needed. 21 capsule 0  . ibuprofen (ADVIL,MOTRIN) 600 MG tablet Take 1 tablet (600 mg total) by mouth every 6 (  six) hours. 30 tablet 1  . oseltamivir (TAMIFLU) 75 MG capsule Take 1 capsule (75 mg total) by mouth 2 (two) times daily. 10 capsule 0  . oxyCODONE-acetaminophen (PERCOCET/ROXICET) 5-325 MG per tablet Take 1-2 tablets by mouth every 4 (four) hours as needed (for pain scale greater than 7). 30 tablet 0  . predniSONE (DELTASONE) 20 MG tablet 3 tabs po day 1, 2 tab po day 2, 1 tab po day 3. 6 tablet 0  . Prenatal Vit-Fe Fumarate-FA (PRENATAL MULTIVITAMIN) TABS tablet Take 1 tablet by mouth daily at 12 noon.     No current facility-administered medications on file prior to visit.    BP 102/68 mmHg  Pulse 93  Temp(Src) 97.9 F (36.6 C) (Oral)  Ht 5' 8.5" (1.74 m)  Wt 175 lb  3.2 oz (79.47 kg)  BMI 26.25 kg/m2  SpO2 98%  LMP 10/27/2015       Objective:   Physical Exam  General  Mental Status - Alert. General Appearance - Well groomed. Not in acute distress.  Skin Rashes- No Rashes.  HEENT Head- Normal. Ear Auditory Canal - Left- Normal. Right - Normal.Tympanic Membrane- Left- mild- moderate bright red. Right- Normal. Eye Sclera/Conjunctiva- Left- Normal. Right- Normal. Nose & Sinuses Nasal Mucosa- Left-  Boggy and Congested. Right-  Boggy and  Congested.Bilateral no  maxillary and no  frontal sinus pressure. Mouth & Throat Lips: Upper Lip- Normal: no dryness, cracking, pallor, cyanosis, or vesicular eruption. Lower Lip-Normal: no dryness, cracking, pallor, cyanosis or vesicular eruption. Buccal Mucosa- Bilateral- No Aphthous ulcers. Oropharynx- No Discharge or Erythema. Tonsils: Characteristics- Bilateral- No Erythema or Congestion. Size/Enlargement- Bilateral- No enlargement. Discharge- bilateral-None.  Neck Neck- Supple. No Masses.   Chest and Lung Exam Auscultation: Breath Sounds:- even and unlabored. But shallow and some expiratory wheezing. Cardiovascular Auscultation:Rythm- Regular, rate and rhythm. Murmurs & Other Heart Sounds:Ausculatation of the heart reveal- No Murmurs.  Lymphatic Head & Neck General Head & Neck Lymphatics: Bilateral: Description- No Localized lymphadenopathy.       Assessment & Plan:  You still have some wheezing. So I will restart taper dose prednison and rx flovent. Use albuterol if needed.  Your left ear may be early infected post flu. Rx cefdnir if you have any ear pain.   Rest hydrate and tylenol for fever.  Follow up in 7 days or as as needed

## 2015-11-10 NOTE — Patient Instructions (Addendum)
You still have some wheezing. So I will restart taper dose prednison and rx flovent. Use albuterol if needed.  Your left ear may be early infected post flu. Rx cefdnir if you have any ear pain.   Rest hydrate and tylenol for fever.  Follow up in 7 days or as as needed

## 2015-11-10 NOTE — Progress Notes (Signed)
Pre visit review using our clinic review tool, if applicable. No additional management support is needed unless otherwise documented below in the visit note. 

## 2015-11-11 ENCOUNTER — Telehealth: Payer: Self-pay | Admitting: Physician Assistant

## 2015-11-11 MED ORDER — BECLOMETHASONE DIPROPIONATE 40 MCG/ACT IN AERS: 2.0000 | INHALATION_SPRAY | Freq: Two times a day (BID) | RESPIRATORY_TRACT | Status: DC

## 2015-11-11 MED ORDER — FLUTICASONE PROPIONATE HFA 110 MCG/ACT IN AERO: 2.0000 | INHALATION_SPRAY | Freq: Two times a day (BID) | RESPIRATORY_TRACT | Status: DC

## 2015-11-11 NOTE — Telephone Encounter (Signed)
Pt called to check on RX. She states she is not breastfeeding. [facility]

## 2015-11-11 NOTE — Telephone Encounter (Signed)
Please advise 

## 2015-11-11 NOTE — Telephone Encounter (Signed)
Caller name: Self  Can be reached: [phone]  Pharmacy:  [id number] [place], [place] - [address] [phone] (Phone) [phone])        Reason for call: Patient states that she was given fluticasone (FLOVENT HFA) 110 MCG/ACT inhaler [[id number]][[id number]] but insurance does not cover it. Request a rx for QVar.

## 2015-11-11 NOTE — Telephone Encounter (Signed)
Will you call down stairs and ask them to run the flovent and qvar. I just sent down both. I would actually prefer flovent. Pt is breast feeding and want to know if flovent covered. It did not show up on lactation warning. But qvar did show lactation warning. So cancel qvar if our pharmacy agrees not recommended with breast feeding. Trying to figure out which one is covered and which is cheaper.

## 2015-11-11 NOTE — Telephone Encounter (Signed)
Did not assess the patient. Please look to see who saw the patient when sending messages as to not delay care. Will forward to CMA of treating provider for review.

## 2015-11-12 MED ORDER — BECLOMETHASONE DIPROPIONATE 40 MCG/ACT IN AERS: 2.0000 | INHALATION_SPRAY | Freq: Two times a day (BID) | RESPIRATORY_TRACT | Status: DC

## 2015-11-12 NOTE — Telephone Encounter (Signed)
Spoke with pt and she states that she would [facility] [place] and she is not currently breastfeeding. Rx sent to pharmacy 11/12/15 and pt was advised.

## 2016-09-27 ENCOUNTER — Other Ambulatory Visit: Payer: Self-pay | Admitting: Family

## 2016-09-27 DIAGNOSIS — J4521 Mild intermittent asthma with (acute) exacerbation: Secondary | ICD-10-CM

## 2016-09-28 ENCOUNTER — Encounter: Payer: Self-pay | Admitting: Medical

## 2016-09-29 ENCOUNTER — Encounter: Payer: Self-pay | Admitting: Medical

## 2016-09-29 MED ORDER — ALBUTEROL SULFATE HFA 108 (90 BASE) MCG/ACT IN AERS: 2.0000 | INHALATION_SPRAY | Freq: Four times a day (QID) | RESPIRATORY_TRACT | 0 refills | Status: DC | PRN

## 2016-09-29 NOTE — Telephone Encounter (Signed)
Relation to XB:MWUXpt:self Call back [phone] Pharmacy: [id number], [place] - [address] [phone] (Phone) [phone] (Fax)     Reason for call:  Patient scheduled a transfer of care / physical appointment for 10/28/16 with Ramon DredgeEdward, patient requesting albuterol (PROVENTIL HFA;VENTOLIN HFA) 108 (90 BASE) MCG/ACT inhaler refill to hold her over until appointment, please advise

## 2016-09-29 NOTE — Telephone Encounter (Signed)
Patient called back to follow up on request for inhaler. Stated that she did not want to go to [place] to see Selena BattenCody and she had seen Ramon DredgeEdward several times. Stated that she had been waiting for a call back all day from either Ramon DredgeEdward or his nurse and she is in need of an inhaler. Patient declined appointment tomorrow stating she needed something to get her through tonight and she will just have to go to Urgent Care.

## 2016-09-29 NOTE — Telephone Encounter (Signed)
I sent a message to patient that I have not seen her since 2015 so will not refill her medication without appointment. Looks like she has been seeing you since that time, although only once or twice a year. Last time was 11/2015 for asthma exacerbation. I told her that she would need an appointment with me or to transfer [facility] so she can get refills unless you are willing to refill.

## 2016-09-29 NOTE — Telephone Encounter (Signed)
rx albuterol inhaler sent in for pt.

## 2016-10-28 ENCOUNTER — Ambulatory Visit (INDEPENDENT_AMBULATORY_CARE_PROVIDER_SITE_OTHER): Payer: BLUE CROSS/BLUE SHIELD | Admitting: Medical

## 2016-10-28 VITALS — BP 130/77 | HR 77 | Temp 98.5°F | Ht 69.0 in | Wt 166.4 lb

## 2016-10-28 DIAGNOSIS — Z0000 Encounter for general adult medical examination without abnormal findings: Secondary | ICD-10-CM

## 2016-10-28 NOTE — Progress Notes (Signed)
Pre visit review using our clinic tool,if applicable. No additional management support is needed unless otherwise documented below in the visit note.  

## 2016-10-28 NOTE — Patient Instructions (Addendum)
For you wellness exam today I have ordered cbc, cmp, tsh, lipid panel, and  ua.. Future orders.  Vaccine up to date.  Recommend exercise and healthy diet.  We will let you know lab results as they come in.  Reviewed your otc ibs-c products. Ingredients I saw looks safe. Might consider using just every other day. Consider prescription med if this stops working.  Follow up date appointment will be determined after lab review.    Preventive Care 18-39 Years, Female Preventive care refers to lifestyle choices and visits with your health care provider that can promote health and wellness. What does preventive care include?  A yearly physical exam. This is also called an annual well check.  Dental exams once or twice a year.  Routine eye exams. Ask your health care provider how often you should have your eyes checked.  Personal lifestyle choices, including:  Daily care of your teeth and gums.  Regular physical activity.  Eating a healthy diet.  Avoiding tobacco and drug use.  Limiting alcohol use.  Practicing safe sex.  Taking vitamin and mineral supplements as recommended by your health care provider. What happens during an annual well check? The services and screenings done by your health care provider during your annual well check will depend on your age, overall health, lifestyle risk factors, and family history of disease. Counseling  Your health care provider may ask you questions about your:  Alcohol use.  Tobacco use.  Drug use.  Emotional well-being.  Home and relationship well-being.  Sexual activity.  Eating habits.  Work and work Statistician.  Method of birth control.  Menstrual cycle.  Pregnancy history. Screening  You may have the following tests or measurements:  Height, weight, and BMI.  Diabetes screening. This is done by checking your blood sugar (glucose) after you have not eaten for a while (fasting).  Blood pressure.  Lipid and  cholesterol levels. These may be checked every 5 years starting at age 47.  Skin check.  Hepatitis C blood test.  Hepatitis B blood test.  Sexually transmitted disease (STD) testing.  BRCA-related cancer screening. This may be done if you have a family history of breast, ovarian, tubal, or peritoneal cancers.  Pelvic exam and Pap test. This may be done every 3 years starting at age 60. Starting at age 45, this may be done every 5 years if you have a Pap test in combination with an HPV test. Discuss your test results, treatment options, and if necessary, the need for more tests with your health care provider. Vaccines  Your health care provider may recommend certain vaccines, such as:  Influenza vaccine. This is recommended every year.  Tetanus, diphtheria, and acellular pertussis (Tdap, Td) vaccine. You may need a Td booster every 10 years.  Varicella vaccine. You may need this if you have not been vaccinated.  HPV vaccine. If you are 3 or younger, you may need three doses over 6 months.  Measles, mumps, and rubella (MMR) vaccine. You may need at least one dose of MMR. You may also need a second dose.  Pneumococcal 13-valent conjugate (PCV13) vaccine. You may need this if you have certain conditions and were not previously vaccinated.  Pneumococcal polysaccharide (PPSV23) vaccine. You may need one or two doses if you smoke cigarettes or if you have certain conditions.  Meningococcal vaccine. One dose is recommended if you are age 69-21 years and a first-year college student living in a residence hall, or if you have one  of several medical conditions. You may also need additional booster doses.  Hepatitis A vaccine. You may need this if you have certain conditions or if you travel or work in places where you may be exposed to hepatitis A.  Hepatitis B vaccine. You may need this if you have certain conditions or if you travel or work in places where you may be exposed to hepatitis  B.  Haemophilus influenzae type b (Hib) vaccine. You may need this if you have certain risk factors. Talk to your health care provider about which screenings and vaccines you need and how often you need them. This information is not intended to replace advice given to you by your health care provider. Make sure you discuss any questions you have with your health care provider. Document Released: 09/14/2001 Document Revised: 04/07/2016 Document Reviewed: 05/20/2015 Elsevier Interactive Patient Education  2017 [employer].

## 2016-10-28 NOTE — Progress Notes (Signed)
Subjective:    Patient ID: Roberta Farley, female    DOB: 04-Oct-1985, 31 y.o.   MRN: [id number]  HPI   I have reviewed pt PMH, PSH, FH, Social History and Surgical History.  Pt works at [employer], pt not exercising, 2 cups of coffee a day, no alcohol, single- 172 kids 33 year old and 3020 months old.  Pt not fasting.  Pt last pap smear was normal. Done by Dr. Einar Crowomlin Gyn. Pt will schedule that.   Pt got flu vaccine.  Tdap up to date.  HIV- negative 2015.  LMP- last wed.  Pt has mild nasal congestion, and pnd. On zyrtec and nasocort for allergies.  Has albuterol available if she needs for wheezing. In past wheezing following allergy flares.   Review of Systems  Constitutional: Negative for chills and fatigue.  HENT: Positive for congestion and postnasal drip. Negative for sinus pain, sinus pressure, sneezing, sore throat and trouble swallowing.   Respiratory: Negative for cough, chest tightness, shortness of breath and wheezing.   Cardiovascular: Negative for chest pain and palpitations.  Gastrointestinal: Negative for abdominal pain, blood in stool, constipation, diarrhea, nausea and vomiting.       Pt mentions she has ibs.she states most of time has constipation type. She take herbal supplement. It helps her have bm daily.  Dx by former pcp stony creeek  Genitourinary: Negative for difficulty urinating, enuresis, flank pain, frequency, hematuria, urgency and vaginal pain.       History of uti.  Musculoskeletal: Negative for back pain, neck pain and neck stiffness.  Skin: Negative for rash.    Past Medical History:  Diagnosis Date  . Asthma   . Chlamydia IBS     Social History   Social History  . Marital status: Single    Spouse name: N/A  . Number of children: 1  . Years of education: N/A   Occupational History  . CNA    Social History Main Topics  . Smoking status: Former Smoker    Quit date: 06/11/2013  . Smokeless tobacco: Never Used   Comment: "Hookah" tobacco    . Alcohol use Yes     Comment: "socially"  . Drug use: No  . Sexual activity: Yes    Partners: Male    Birth control/ protection: Injection, None   Other Topics Concern  . Not on file   Social History Narrative  . No narrative on file    Past Surgical History:  Procedure Laterality Date  . INDUCED ABORTION     x 2  . NO PAST SURGERIES      Family History  Problem Relation Age of Onset  . Hypertension    . Congestive Heart Failure    . Diabetes    . Ovarian cancer Maternal Grandmother   . Cancer Maternal Grandmother   . Diabetes Mother   . Hypertension Mother   . Hyperlipidemia Father   . Diabetes Father     Allergies  Allergen Reactions  . Other Rash    Strawberry/kiwi flavor only,  Not allergic to actual fruits    Current Outpatient Prescriptions on File Prior to Visit  Medication Sig Dispense Refill  . albuterol (PROVENTIL HFA;VENTOLIN HFA) 108 (90 BASE) MCG/ACT inhaler Inhale 1-2 puffs into the lungs every 6 (six) hours as needed for wheezing or shortness of breath. 1 Inhaler 0  . albuterol (PROVENTIL HFA;VENTOLIN HFA) 108 (90 Base) MCG/ACT inhaler Inhale 2 puffs into the lungs every 6 (six)  hours as needed for wheezing or shortness of breath. 1 Inhaler 0  . ibuprofen (ADVIL,MOTRIN) 600 MG tablet Take 1 tablet (600 mg total) by mouth every 6 (six) hours. 30 tablet 1  . beclomethasone (QVAR) 40 MCG/ACT inhaler Inhale 2 puffs into the lungs 2 (two) times daily. (Patient not taking: Reported on 10/28/2016) 1 Inhaler 1  . benzonatate (TESSALON) 100 MG capsule Take 1 capsule (100 mg total) by mouth 3 (three) times daily as needed. (Patient not taking: Reported on 10/28/2016) 21 capsule 0  . Prenatal Vit-Fe Fumarate-FA (PRENATAL MULTIVITAMIN) TABS tablet Take 1 tablet by mouth daily at 12 noon.     No current facility-administered medications on file prior to visit.     BP 130/77   Pulse 77   Temp 98.5 F (36.9 C) (Oral)   Ht 5\' 9"   (1.753 m)   Wt 166 lb 6.4 oz (75.5 kg)   LMP 10/20/2016   SpO2 100%   Breastfeeding? No   BMI 24.57 kg/m       Objective:   Physical Exam   General  Mental Status - Alert. General Appearance - Well groomed. Not in acute distress.  Skin Rashes- No Rashes.  HEENT Head- Normal. Ear Auditory Canal - Left- Normal. Right - Normal.Tympanic Membrane- Left- Normal. Right- Normal. Eye Sclera/Conjunctiva- Left- Normal. Right- Normal. Nose & Sinuses Nasal Mucosa- Left-  Boggy and Congested. Right-  Boggy and  Congested.Bilateral  No maxillary and no  frontal sinus pressure. Mouth & Throat Lips: Upper Lip- Normal: no dryness, cracking, pallor, cyanosis, or vesicular eruption. Lower Lip-Normal: no dryness, cracking, pallor, cyanosis or vesicular eruption. Buccal Mucosa- Bilateral- No Aphthous ulcers. Oropharynx- No Discharge or Erythema. Tonsils: Characteristics- Bilateral- No Erythema or Congestion. Size/Enlargement- Bilateral- No enlargement. Discharge- bilateral-None.   Neck Carotid Arteries- Normal color. Moisture- Normal Moisture. No carotid bruits. No JVD.  Chest and Lung Exam Auscultation: Breath Sounds:-Normal.  Cardiovascular Auscultation:Rythm- Regular. Murmurs & Other Heart Sounds:Auscultation of the heart reveals- No Murmurs.  Abdomen Inspection:-Inspeection Normal. Palpation/Percussion:Note:No mass. Palpation and Percussion of the abdomen reveal- Non Tender, Non Distended + BS, no rebound or guarding.   Neurologic Cranial Nerve exam:- CN III-XII intact(No nystagmus), symmetric smile. Strength:- 5/5 equal and symmetric strength both upper and lower extremities.       Assessment & Plan:  For you wellness exam today I have ordered cbc, cmp, tsh, lipid panel, and ua.(future order)  Vaccine up to date.  Recommend exercise and healthy diet.  We will let you know lab results as they come in.  Reviewed your otc ibs-c products. Ingredients I saw looks safe.  Might consider using just every other day. Consider prescriptiom if this stops working.  Follow up date appointment will be determined after lab review.   Pt aware her otc not fda approved. Discussed this with her.   Devynn Scheff, Ramon Dredge, PA-C

## 2016-11-01 ENCOUNTER — Other Ambulatory Visit (INDEPENDENT_AMBULATORY_CARE_PROVIDER_SITE_OTHER): Payer: BLUE CROSS/BLUE SHIELD

## 2016-11-01 DIAGNOSIS — Z0000 Encounter for general adult medical examination without abnormal findings: Secondary | ICD-10-CM | POA: Diagnosis not present

## 2016-11-01 LAB — COMPREHENSIVE METABOLIC PANEL
ALT: 11 U/L (ref 0–35)
AST: 15 U/L (ref 0–37)
Albumin: 4.3 g/dL (ref 3.5–5.2)
Alkaline Phosphatase: 30 U/L — ABNORMAL LOW (ref 39–117)
BUN: 11 mg/dL (ref 6–23)
CO2: 28 meq/L (ref 19–32)
CREATININE: 0.82 mg/dL (ref 0.40–1.20)
Calcium: 9.5 mg/dL (ref 8.4–10.5)
Chloride: 105 mEq/L (ref 96–112)
GFR: 103.34 mL/min (ref >60.00)
Glucose, Bld: 98 mg/dL (ref 70–99)
Potassium: 3.9 mEq/L (ref 3.5–5.1)
SODIUM: 139 meq/L (ref 135–145)
Total Bilirubin: 0.4 mg/dL (ref 0.2–1.2)
Total Protein: 7.3 g/dL (ref 6.0–8.3)

## 2016-11-01 LAB — CBC WITH DIFFERENTIAL/PLATELET
BASOS PCT: 0.6 % (ref 0.0–3.0)
Basophils Absolute: 0 10*3/uL (ref 0.0–0.1)
EOS ABS: 0.2 10*3/uL (ref 0.0–0.7)
Eosinophils Relative: 2.9 % (ref 0.0–5.0)
HCT: 41.3 % (ref 36.0–46.0)
Hemoglobin: 13.7 g/dL (ref 12.0–15.0)
LYMPHS ABS: 3.6 10*3/uL (ref 0.7–4.0)
Lymphocytes Relative: 52.6 % — ABNORMAL HIGH (ref 12.0–46.0)
MCHC: 33.2 g/dL (ref 30.0–36.0)
MCV: 98.6 fl (ref 78.0–100.0)
MONO ABS: 0.3 10*3/uL (ref 0.1–1.0)
Monocytes Relative: 4.8 % (ref 3.0–12.0)
NEUTROS ABS: 2.6 10*3/uL (ref 1.4–7.7)
Neutrophils Relative %: 39.1 % — ABNORMAL LOW (ref 43.0–77.0)
PLATELETS: 269 10*3/uL (ref 150.0–400.0)
RBC: 4.19 Mil/uL (ref 3.87–5.11)
RDW: 12.3 % (ref 11.5–15.5)
WBC: 6.8 10*3/uL (ref 4.0–10.5)

## 2016-11-01 LAB — LIPID PANEL
CHOLESTEROL: 115 mg/dL (ref 0–200)
HDL: 50.5 mg/dL (ref >39.00)
LDL CALC: 47 mg/dL (ref 0–99)
NONHDL: 64.47
Total CHOL/HDL Ratio: 2
Triglycerides: 86 mg/dL (ref 0.0–149.0)
VLDL: 17.2 mg/dL (ref 0.0–40.0)

## 2016-11-01 LAB — TSH: TSH: 0.83 u[IU]/mL (ref 0.35–4.50)

## 2016-11-02 ENCOUNTER — Encounter: Payer: Self-pay | Admitting: Medical

## 2016-11-02 DIAGNOSIS — R7989 Other specified abnormal findings of blood chemistry: Secondary | ICD-10-CM

## 2017-01-03 ENCOUNTER — Ambulatory Visit (HOSPITAL_BASED_OUTPATIENT_CLINIC_OR_DEPARTMENT_OTHER)
Admission: RE | Admit: 2017-01-03 | Discharge: 2017-01-03 | Disposition: A | Discharge status: Home / Self Care | Payer: BLUE CROSS/BLUE SHIELD | Source: Ambulatory Visit | Attending: Medical | Admitting: Medical

## 2017-01-03 ENCOUNTER — Ambulatory Visit (INDEPENDENT_AMBULATORY_CARE_PROVIDER_SITE_OTHER): Payer: BLUE CROSS/BLUE SHIELD | Admitting: Medical

## 2017-01-03 VITALS — BP 128/77 | HR 76 | Temp 98.1°F | Resp 16 | Ht 69.0 in | Wt 170.2 lb

## 2017-01-03 DIAGNOSIS — M4186 Other forms of scoliosis, lumbar region: Secondary | ICD-10-CM | POA: Diagnosis not present

## 2017-01-03 DIAGNOSIS — S46812A Strain of other muscles, fascia and tendons at shoulder and upper arm level, left arm, initial encounter: Secondary | ICD-10-CM

## 2017-01-03 DIAGNOSIS — M545 Low back pain, unspecified: Secondary | ICD-10-CM

## 2017-01-03 MED ORDER — ALBUTEROL SULFATE HFA 108 (90 BASE) MCG/ACT IN AERS: 2.0000 | INHALATION_SPRAY | Freq: Four times a day (QID) | RESPIRATORY_TRACT | 0 refills | Status: AC | PRN

## 2017-01-03 MED ORDER — CYCLOBENZAPRINE HCL 5 MG PO TABS: 5.0000 mg | ORAL_TABLET | Freq: Every day | ORAL | 0 refills | Status: DC

## 2017-01-03 MED ORDER — DICLOFENAC SODIUM 75 MG PO TBEC: 75.0000 mg | DELAYED_RELEASE_TABLET | Freq: Two times a day (BID) | ORAL | 0 refills | Status: DC

## 2017-01-03 MED ORDER — TRAMADOL HCL 50 MG PO TABS: 50.0000 mg | ORAL_TABLET | Freq: Four times a day (QID) | ORAL | 0 refills | Status: DC | PRN

## 2017-01-03 NOTE — Progress Notes (Signed)
Subjective:    Patient ID: Roberta Farley, female    DOB: Mar 03, 1986, 31 y.o.   MRN: [id number]  HPI  Pt in for back pain in lower area. Points to rt side paralumbar area. No heavy work outs or obvious injury. Pain states no rash. No uti signs or symptoms. When she palpates mid lumbar area has pain. No pain radiating to her legs. Pt states scoliosis in upper tspine but no upper back pain.  Pt took some motrin for pain(no reaction to motrin when she took today). Did not help much.  Also left side trapezius area is tight. Knot type feeling per pt. Pt states in past responds to massage in past.  LMP- Pt on ortho tricyclen.  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      Review of Systems  Constitutional: Negative for chills, fatigue and fever.  Respiratory: Negative for cough, chest tightness, shortness of breath and wheezing.   Cardiovascular: Negative for chest pain and palpitations.  Gastrointestinal: Negative for abdominal pain.  Genitourinary: Negative for difficulty urinating, dysuria, flank pain, frequency and pelvic pain.  Musculoskeletal: Positive for back pain.       Lt trapezius pain. See hpi.  Neurological: Negative for dizziness and headaches.  Hematological: Negative for adenopathy. Does not bruise/bleed easily.  Psychiatric/Behavioral: Negative for behavioral problems and confusion.    Past Medical History:  Diagnosis Date  . Asthma   . Chlamydia IBS     Social History   Social History  . Marital status: Single    Spouse name: N/A  . Number of children: 1  . Years of education: N/A   Occupational History  . CNA    Social History Main Topics  . Smoking status: Former Smoker    Quit date: 06/11/2013  . Smokeless tobacco: Never Used     Comment: "Hookah" tobacco    . Alcohol use Yes     Comment: "socially"  . Drug use: No  . Sexual activity: Yes    Partners:  Male    Birth control/ protection: Injection, None   Other Topics Concern  . Not on file   Social History Narrative  . No narrative on file    Past Surgical History:  Procedure Laterality Date  . INDUCED ABORTION     x 2  . NO PAST SURGERIES      Family History  Problem Relation Age of Onset  . Hypertension Unknown   . Congestive Heart Failure Unknown   . Diabetes Unknown   . Ovarian cancer Maternal Grandmother   . Cancer Maternal Grandmother   . Diabetes Mother   . Hypertension Mother   . Hyperlipidemia Father   . Diabetes Father     Allergies  Allergen Reactions  . Other Rash    Strawberry/kiwi flavor only,  Not allergic to actual fruits    Current Outpatient Prescriptions on File Prior to Visit  Medication Sig Dispense Refill  . albuterol (PROVENTIL HFA;VENTOLIN HFA) 108 (90 BASE) MCG/ACT inhaler Inhale 1-2 puffs into the lungs every 6 (six) hours as needed for wheezing or shortness of breath. 1 Inhaler 0  . beclomethasone (QVAR) 40 MCG/ACT inhaler Inhale 2 puffs into the lungs 2 (two) times daily. 1 Inhaler 1  . ibuprofen (ADVIL,MOTRIN) 600 MG tablet Take 1 tablet (600 mg total) by mouth every 6 (six) hours. 30 tablet 1  . Prenatal Vit-Fe  Fumarate-FA (PRENATAL MULTIVITAMIN) TABS tablet Take 1 tablet by mouth daily at 12 noon.     No current facility-administered medications on file prior to visit.     BP 128/77 (BP Location: Right Arm, Patient Position: Sitting, Cuff Size: Normal)   Pulse 76   Temp 98.1 F (36.7 C) (Oral)   Resp 16   Ht 5\' 9"  (1.753 m)   Wt 170 lb 3.2 oz (77.2 kg)   LMP 12/12/2016   SpO2 100%   BMI 25.13 kg/m       Objective:   Physical Exam   General Appearance- Not in acute distress.  Neck- no mid cspine tenderness. Left side trapezius tender to palpation.  Chest and Lung Exam Auscultation: Breath sounds:-Normal. Clear even and unlabored. Adventitious sounds:- No Adventitious sounds.  Cardiovascular Auscultation:Rythm -  Regular, rate and rythm. Heart Sounds -Normal heart sounds.  Abdomen Inspection:-Inspection Normal.  Palpation/Perucssion: Palpation and Percussion of the abdomen reveal- Non Tender, No Rebound tenderness, No rigidity(Guarding) and No Palpable abdominal masses.  Liver:-Normal.  Spleen:- Normal.   Back Mid lumbar spine tenderness to palpation. Pain on straight leg lift. Pain on lateral movements and flexion/extension of the spine.  Lower ext neurologic  L5-S1 sensation intact bilaterally. Normal patellar reflexes bilaterally. No foot drop bilaterally.     Assessment & Plan:  For lower back pain will get xray of your lumbar spine.  Will rx diclofenac for pain and inflammation. Rx flexeril to use only at night. For break through pain rx tramadol.  Back exercises as tolerated. If pain persists consider PT or Sports med.  Follow up in 7 days or as needed

## 2017-01-03 NOTE — Patient Instructions (Addendum)
For lower back pain will get xray of your lumbar spine.  Will rx diclofenac for pain and inflammation. Rx flexeril to use only at night. For break through pain rx tramadol.  Back exercises as tolerated. If pain persists consider PT or Sports med.  Follow up in 7 days or as needed   Back Exercises If you have pain in your back, do these exercises 2-3 times each day or as told by your doctor. When the pain goes away, do the exercises once each day, but repeat the steps more times for each exercise (do more repetitions). If you do not have pain in your back, do these exercises once each day or as told by your doctor. Exercises Single Knee to Chest  Do these steps 3-5 times in a row for each leg: 1. Lie on your back on a firm bed or the floor with your legs stretched out. 2. Bring one knee to your chest. 3. Hold your knee to your chest by grabbing your knee or thigh. 4. Pull on your knee until you feel a gentle stretch in your lower back. 5. Keep doing the stretch for 10-30 seconds. 6. Slowly let go of your leg and straighten it.  Pelvic Tilt  Do these steps 5-10 times in a row: 1. Lie on your back on a firm bed or the floor with your legs stretched out. 2. Bend your knees so they point up to the ceiling. Your feet should be flat on the floor. 3. Tighten your lower belly (abdomen) muscles to press your lower back against the floor. This will make your tailbone point up to the ceiling instead of pointing down to your feet or the floor. 4. Stay in this position for 5-10 seconds while you gently tighten your muscles and breathe evenly.  Cat-Cow  Do these steps until your lower back bends more easily: 1. Get on your hands and knees on a firm surface. Keep your hands under your shoulders, and keep your knees under your hips. You may put padding under your knees. 2. Let your head hang down, and make your tailbone point down to the floor so your lower back is round like the back of a  cat. 3. Stay in this position for 5 seconds. 4. Slowly lift your head and make your tailbone point up to the ceiling so your back hangs low (sags) like the back of a cow. 5. Stay in this position for 5 seconds.  Press-Ups  Do these steps 5-10 times in a row: 1. Lie on your belly (face-down) on the floor. 2. Place your hands near your head, about shoulder-width apart. 3. While you keep your back relaxed and keep your hips on the floor, slowly straighten your arms to raise the top half of your body and lift your shoulders. Do not use your back muscles. To make yourself more comfortable, you may change where you place your hands. 4. Stay in this position for 5 seconds. 5. Slowly return to lying flat on the floor.  Bridges  Do these steps 10 times in a row: 1. Lie on your back on a firm surface. 2. Bend your knees so they point up to the ceiling. Your feet should be flat on the floor. 3. Tighten your butt muscles and lift your butt off of the floor until your waist is almost as high as your knees. If you do not feel the muscles working in your butt and the back of your thighs, slide your  feet 1-2 inches farther away from your butt. 4. Stay in this position for 3-5 seconds. 5. Slowly lower your butt to the floor, and let your butt muscles relax.  If this exercise is too easy, try doing it with your arms crossed over your chest. Belly Crunches  Do these steps 5-10 times in a row: 1. Lie on your back on a firm bed or the floor with your legs stretched out. 2. Bend your knees so they point up to the ceiling. Your feet should be flat on the floor. 3. Cross your arms over your chest. 4. Tip your chin a little bit toward your chest but do not bend your neck. 5. Tighten your belly muscles and slowly raise your chest just enough to lift your shoulder blades a tiny bit off of the floor. 6. Slowly lower your chest and your head to the floor.  Back Lifts Do these steps 5-10 times in a row: 1. Lie  on your belly (face-down) with your arms at your sides, and rest your forehead on the floor. 2. Tighten the muscles in your legs and your butt. 3. Slowly lift your chest off of the floor while you keep your hips on the floor. Keep the back of your head in line with the curve in your back. Look at the floor while you do this. 4. Stay in this position for 3-5 seconds. 5. Slowly lower your chest and your face to the floor.  Contact a doctor if:  Your back pain gets a lot worse when you do an exercise.  Your back pain does not lessen 2 hours after you exercise. If you have any of these problems, stop doing the exercises. Do not do them again unless your doctor says it is okay. Get help right away if:  You have sudden, very bad back pain. If this happens, stop doing the exercises. Do not do them again unless your doctor says it is okay. This information is not intended to replace advice given to you by your health care provider. Make sure you discuss any questions you have with your health care provider. Document Released: 08/21/2010 Document Revised: 12/25/2015 Document Reviewed: 09/12/2014 Elsevier Interactive Patient Education  [employer].

## 2017-01-04 ENCOUNTER — Telehealth: Payer: Self-pay | Admitting: Medical

## 2017-01-04 NOTE — Telephone Encounter (Signed)
Pt returned call to jasmine for lab results. Please call pt back with lab results.

## 2017-01-05 NOTE — Telephone Encounter (Signed)
Notified pt of results 

## 2017-07-30 ENCOUNTER — Ambulatory Visit (HOSPITAL_COMMUNITY)
Admission: EM | Admit: 2017-07-30 | Discharge: 2017-07-30 | Discharge status: Against Medical Advice | Payer: BLUE CROSS/BLUE SHIELD

## 2017-07-30 NOTE — ED Notes (Signed)
Pt informed registration clerk that she is unable to stay. 

## 2017-08-01 ENCOUNTER — Telehealth: Payer: Self-pay

## 2017-08-01 NOTE — Telephone Encounter (Signed)
Follow up call made to patiet regartding call she made to [facility] on 07/30/17,Received notification today. Patietn states she went to [facility] regarding her UTI symptoms and was placed on ABO.

## 2017-08-04 ENCOUNTER — Encounter: Payer: Self-pay | Admitting: Medical

## 2017-08-04 ENCOUNTER — Ambulatory Visit (INDEPENDENT_AMBULATORY_CARE_PROVIDER_SITE_OTHER): Payer: BLUE CROSS/BLUE SHIELD | Admitting: Medical

## 2017-08-04 ENCOUNTER — Other Ambulatory Visit (HOSPITAL_COMMUNITY)
Admission: RE | Admit: 2017-08-04 | Discharge: 2017-08-04 | Disposition: A | Discharge status: Home / Self Care | Payer: BLUE CROSS/BLUE SHIELD | Source: Ambulatory Visit | Attending: Medical | Admitting: Medical

## 2017-08-04 VITALS — BP 115/76 | HR 78 | Temp 98.7°F | Resp 16 | Ht 69.0 in | Wt 178.6 lb

## 2017-08-04 DIAGNOSIS — R300 Dysuria: Secondary | ICD-10-CM

## 2017-08-04 DIAGNOSIS — Z113 Encounter for screening for infections with a predominantly sexual mode of transmission: Secondary | ICD-10-CM | POA: Diagnosis not present

## 2017-08-04 DIAGNOSIS — M549 Dorsalgia, unspecified: Secondary | ICD-10-CM

## 2017-08-04 DIAGNOSIS — R8290 Unspecified abnormal findings in urine: Secondary | ICD-10-CM | POA: Diagnosis not present

## 2017-08-04 LAB — POC URINALSYSI DIPSTICK (AUTOMATED)
BILIRUBIN UA: NEGATIVE
Blood, UA: NEGATIVE
GLUCOSE UA: NEGATIVE
KETONES UA: NEGATIVE
Leukocytes, UA: NEGATIVE
Nitrite, UA: NEGATIVE
Protein, UA: NEGATIVE
Urobilinogen, UA: NEGATIVE E.U./dL — AB
pH, UA: 6 (ref 5.0–8.0)

## 2017-08-04 MED ORDER — CEFTRIAXONE SODIUM 500 MG IJ SOLR
500.0000 mg | Freq: Once | INTRAMUSCULAR | Status: AC
  Administered 2017-08-04: 500 mg via INTRAMUSCULAR

## 2017-08-04 NOTE — Progress Notes (Signed)
Subjective:    Patient ID: Roberta Farley, female    DOB: 05/01/1986, 32 y.o.   MRN: [id number]  HPI   Pt in for follow up.  She went to [facility] urgent [facility] on Saturday night. She only had odor to urine at that time. Pt was given augmenitn x 7 days for uti.   But now has pain over bladder and some mild back.  She has some nausea but no fever or chills.  Pt was given augmenitn x 7 days for uti.  LMP- December 22 nd- 26 th. 2 at home pregnancy test over last day both negative  Greater than 100,000 ecoli on recent study. Called [facility] and per sensitivity report sensitive to amoxicillin.  Pt did get diflucan in event of yeast infection.   Review of Systems  Constitutional: Negative for chills, fatigue and fever.  Respiratory: Negative for cough, chest tightness, shortness of breath and wheezing.   Cardiovascular: Negative for chest pain and palpitations.  Gastrointestinal: Negative for abdominal pain.  Genitourinary: Positive for frequency and urgency. Negative for flank pain, genital sores, menstrual problem, vaginal bleeding, vaginal discharge and vaginal pain.       Bladder area pain and odor to urine.  Musculoskeletal: Positive for back pain. Negative for gait problem and neck stiffness.  Skin: Negative for rash.  Neurological: Negative for dizziness, syncope, weakness, numbness and headaches.  Hematological: Negative for adenopathy. Does not bruise/bleed easily.  Psychiatric/Behavioral: Negative for behavioral problems and confusion. The patient is not nervous/anxious.    Past Medical History:  Diagnosis Date  . Asthma   . Chlamydia IBS     Social History   Socioeconomic History  . Marital status: Single    Spouse name: Not on file  . Number of children: 1  . Years of education: Not on file  . Highest education level: Not on file  Social Needs  . Financial resource strain: Not on file  . Food insecurity - worry: Not on file  . Food insecurity -  inability: Not on file  . Transportation needs - medical: Not on file  . Transportation needs - non-medical: Not on file  Occupational History  . Occupation: CNA  Tobacco Use  . Smoking status: Former Smoker    Last attempt to quit: 06/11/2013    Years since quitting: 4.1  . Smokeless tobacco: Never Used  . Tobacco comment: "Hookah" tobacco    Substance and Sexual Activity  . Alcohol use: Yes    Comment: "socially"  . Drug use: No  . Sexual activity: Yes    Partners: Male    Birth control/protection: Injection, None  Other Topics Concern  . Not on file  Social History Narrative  . Not on file    Past Surgical History:  Procedure Laterality Date  . INDUCED ABORTION     x 2  . NO PAST SURGERIES      Family History  Problem Relation Age of Onset  . Hypertension Unknown   . Congestive Heart Failure Unknown   . Diabetes Unknown   . Ovarian cancer Maternal Grandmother   . Cancer Maternal Grandmother   . Diabetes Mother   . Hypertension Mother   . Hyperlipidemia Father   . Diabetes Father     Allergies  Allergen Reactions  . Other Rash    Strawberry/kiwi flavor only,  Not allergic to actual fruits    Current Outpatient Medications on File Prior to Visit  Medication Sig Dispense Refill  . albuterol (  PROVENTIL HFA;VENTOLIN HFA) 108 (90 BASE) MCG/ACT inhaler Inhale 1-2 puffs into the lungs every 6 (six) hours as needed for wheezing or shortness of breath. 1 Inhaler 0  . albuterol (PROVENTIL HFA;VENTOLIN HFA) 108 (90 Base) MCG/ACT inhaler Inhale 2 puffs into the lungs every 6 (six) hours as needed for wheezing or shortness of breath. 1 Inhaler 0  . amoxicillin (AMOXIL) 875 MG tablet Take 875 mg by mouth 2 (two) times daily.    . meloxicam (MOBIC) 15 MG tablet Take 15 mg by mouth daily.    . Norgestimate-Ethinyl Estradiol Triphasic (TRI-LO-MARZIA) 0.18/0.215/0.25 MG-25 MCG tab Take 1 tablet by mouth daily.     No current facility-administered medications on file prior to  visit.     BP 115/76   Pulse 78   Temp 98.7 F (37.1 C) (Oral)   Resp 16   Ht 5\' 9"  (1.753 m)   Wt 178 lb 9.6 oz (81 kg)   SpO2 100%   BMI 26.37 kg/m       Objective:   Physical Exam  General Appearance- Not in acute distress.  HEENT Eyes- Scleraeral/Conjuntiva-bilat- Not Yellow. Mouth & Throat- Normal.  Chest and Lung Exam Auscultation: Breath sounds:-Normal. Adventitious sounds:- No Adventitious sounds.  Cardiovascular Auscultation:Rythm - Regular. Heart Sounds -Normal heart sounds.  Abdomen Inspection:-Inspection Normal.  Palpation/Perucssion: Palpation and Percussion of the abdomen reveal- Non Tender, No Rebound tenderness, No rigidity(Guarding) and No Palpable abdominal masses.  Liver:-Normal.  Spleen:- Normal.   Back- faint left cva area pain.      Assessment & Plan:  You have some uti symptoms despite being in augmentin. Will give rocephin 500 mg im pending repeat urine culture.  Will also add urine ancillary studies today. These will include studies for yeast, bv and other bacteria.  If you symptoms worsen or change pending study results please let us know.   Follow up in 7 days or as needed

## 2017-08-04 NOTE — Patient Instructions (Signed)
You have some uti symptoms despite being in augmentin. Will give rocephin 500 mg im pending repeat urine culture.  Will also add urine ancillary studies today. These will include studies for yeast, bv and other bacteria.  If you symptoms worsen or change pending study results please let us know.   Follow up in 7 days or as needed

## 2017-08-05 IMAGING — DX DG LUMBAR SPINE 2-3V
3 series · 3 of 3 positions shown · non-contrast
Comparison: AP and lateral views of the lumbar spine February 22, 2014

CLINICAL DATA: O awakened from sleep with the burning and pulling
sensation in her low back that has not subsided. No known injury. No
radicular symptoms reported.

EXAM:
LUMBAR SPINE - 2-3 VIEW

[l-spine ap]
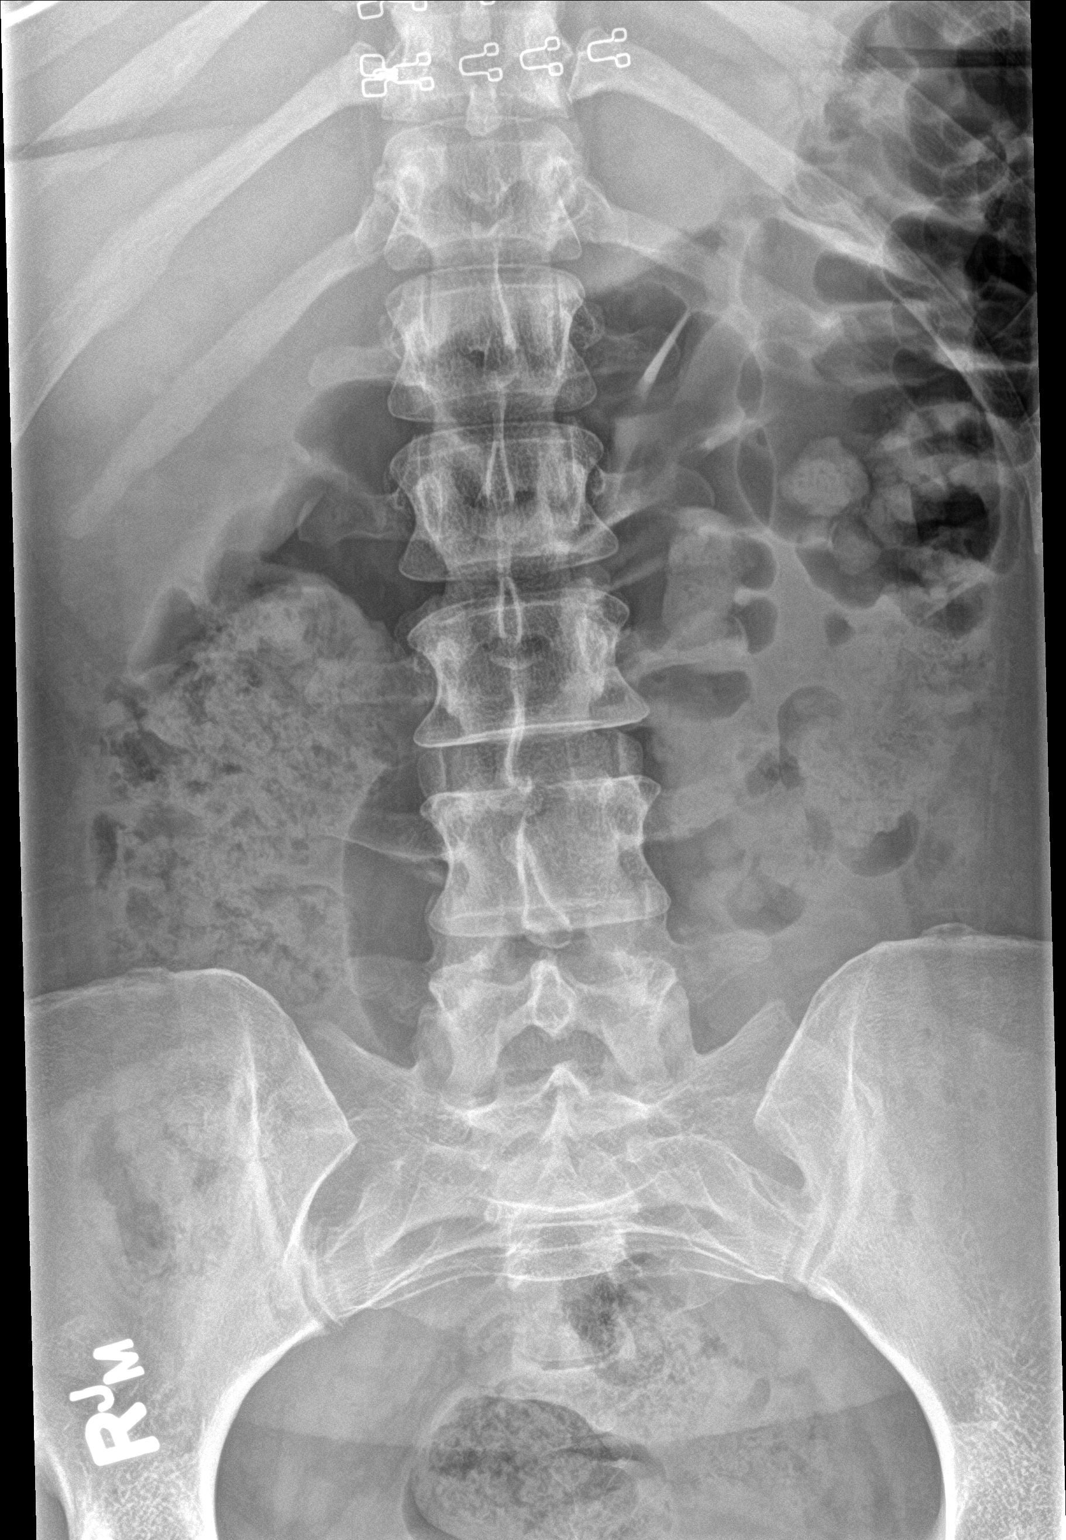

[l-spine lat]
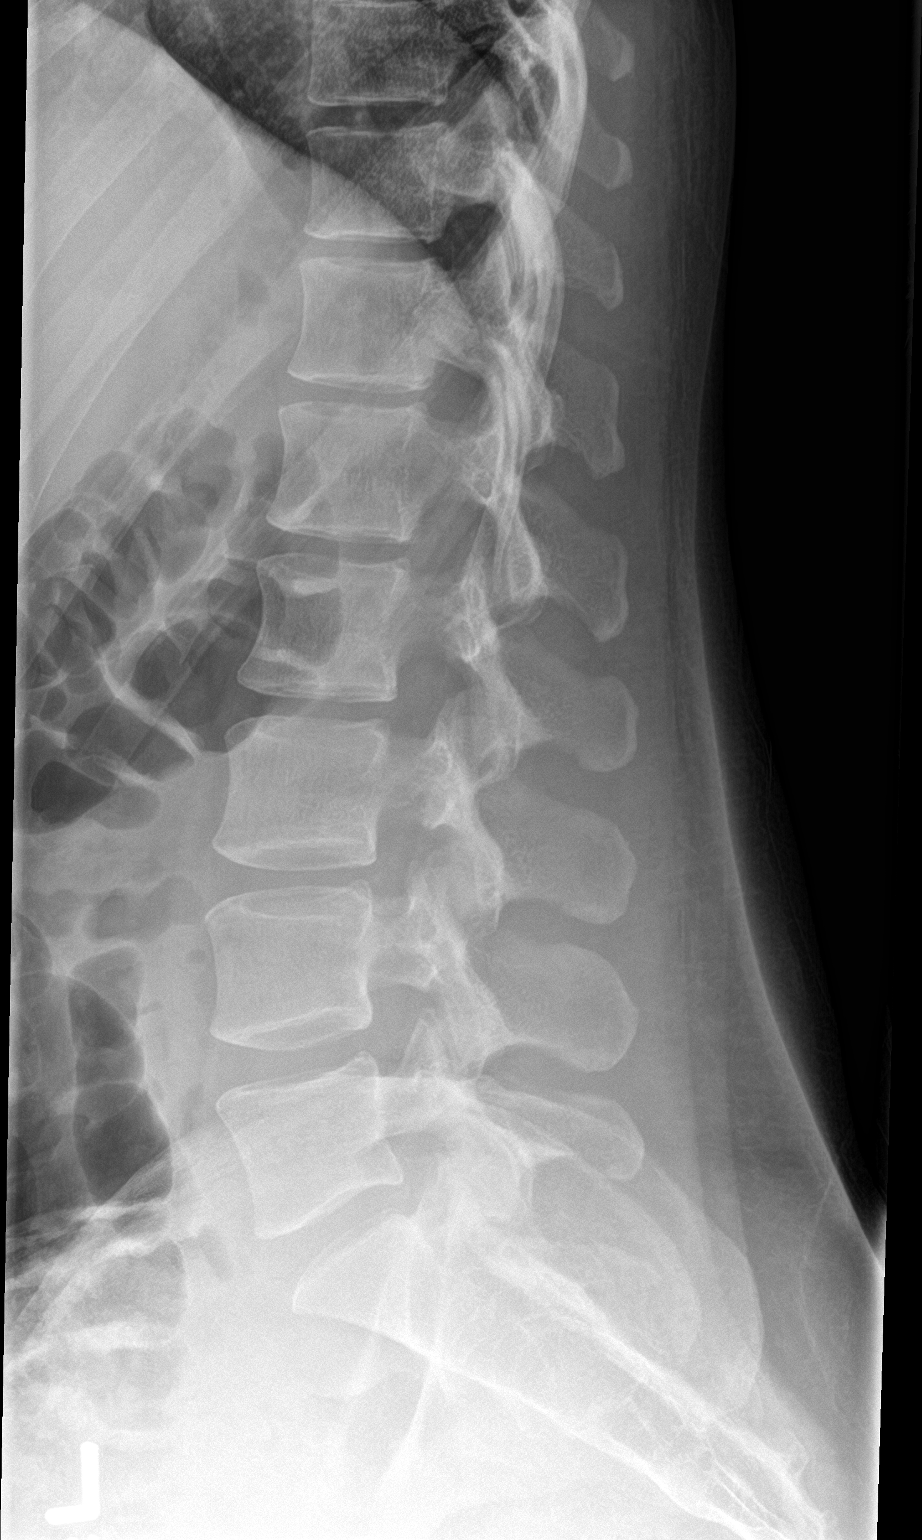

[l-spine spot]
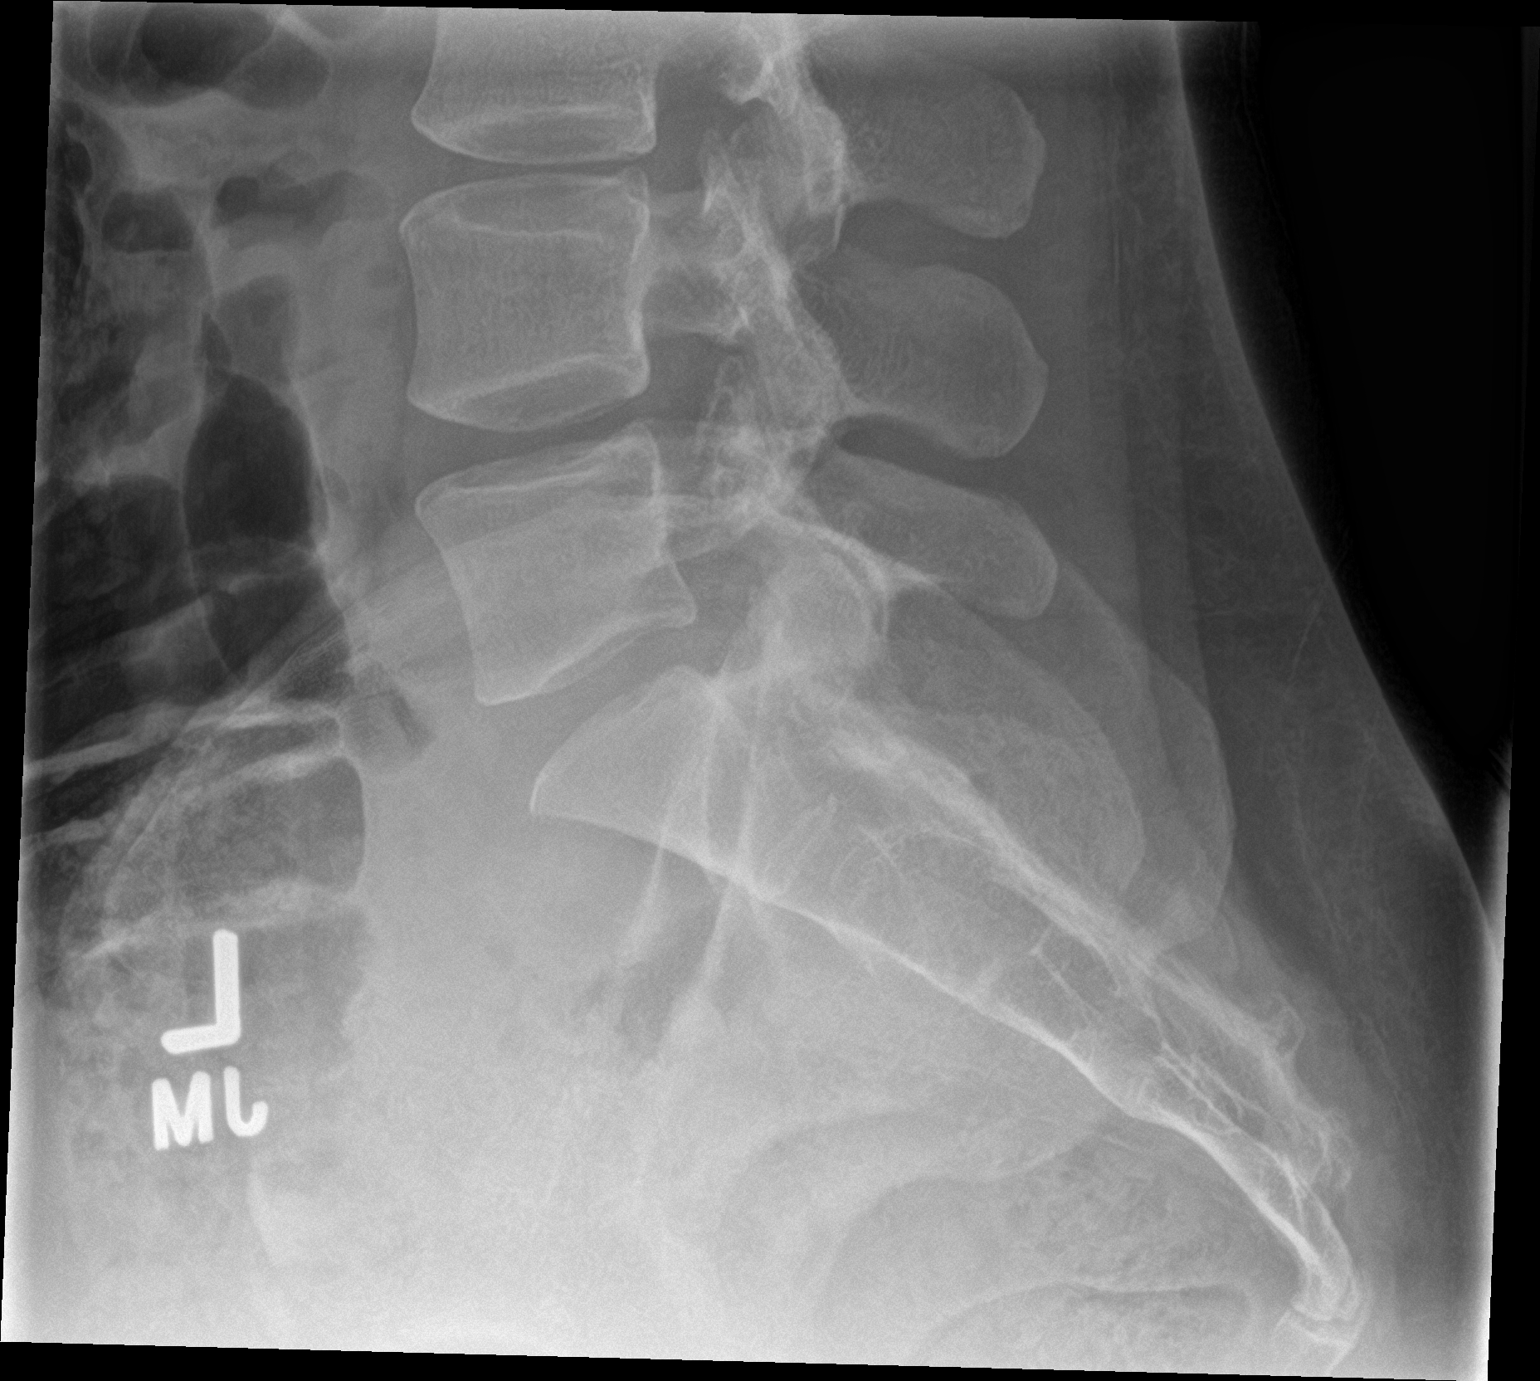

[3 of 3 positions shown; findings below may reference images not displayed]

FINDINGS: Previously demonstrated scoliosis is nearly imperceptible with only
a very subtle curvature convex toward the right noted centered at
L2. The lumbar vertebral bodies are preserved in height. The disc
space heights are well maintained. There is no spondylolisthesis.
There is no significant facet joint hypertrophy. The pedicles and
transverse processes are intact. The observed portions of the sacrum
are normal.
IMPRESSION: There is no acute bony abnormality. Decreased scoliosis is noted
with no more than minimal curvature convex toward the right noted
today.

## 2017-08-05 NOTE — Addendum Note (Signed)
Addended by: Verdie ShireBAYNES, Izeyah Deike M on: 08/05/2017 11:52 AM   Modules accepted: Orders

## 2017-08-06 LAB — URINE CULTURE
MICRO NUMBER:: 90015234
Result:: NO GROWTH
SPECIMEN QUALITY: ADEQUATE

## 2017-08-08 ENCOUNTER — Telehealth: Payer: Self-pay | Admitting: Medical

## 2017-08-08 ENCOUNTER — Telehealth: Payer: Self-pay | Admitting: *Deleted

## 2017-08-08 LAB — URINE CYTOLOGY ANCILLARY ONLY
CHLAMYDIA, DNA PROBE: NEGATIVE
NEISSERIA GONORRHEA: NEGATIVE
Trichomonas: NEGATIVE

## 2017-08-08 NOTE — Telephone Encounter (Signed)
Pt wants to talk with someone in the office not [facility] regarding her test results

## 2017-08-08 NOTE — Telephone Encounter (Signed)
Received Medical records from [facility]; forwarded to provider/SLS 01/07

## 2017-08-09 ENCOUNTER — Encounter: Payer: Self-pay | Admitting: Medical

## 2017-08-10 LAB — URINE CYTOLOGY ANCILLARY ONLY
BACTERIAL VAGINITIS: NEGATIVE
Candida vaginitis: NEGATIVE

## 2017-10-10 ENCOUNTER — Encounter: Payer: Self-pay | Admitting: Medical

## 2017-10-10 ENCOUNTER — Ambulatory Visit: Payer: BLUE CROSS/BLUE SHIELD | Admitting: Medical

## 2017-10-10 VITALS — BP 133/83 | HR 73 | Temp 98.9°F | Resp 16 | Ht 69.0 in | Wt 173.0 lb

## 2017-10-10 DIAGNOSIS — M791 Myalgia, unspecified site: Secondary | ICD-10-CM

## 2017-10-10 DIAGNOSIS — J0100 Acute maxillary sinusitis, unspecified: Secondary | ICD-10-CM

## 2017-10-10 DIAGNOSIS — J40 Bronchitis, not specified as acute or chronic: Secondary | ICD-10-CM | POA: Diagnosis not present

## 2017-10-10 DIAGNOSIS — R6889 Other general symptoms and signs: Secondary | ICD-10-CM | POA: Diagnosis not present

## 2017-10-10 LAB — POCT INFLUENZA A/B
INFLUENZA A, POC: NEGATIVE
INFLUENZA B, POC: NEGATIVE

## 2017-10-10 MED ORDER — FLUTICASONE PROPIONATE 50 MCG/ACT NA SUSP: 2.0000 | Freq: Every day | NASAL | 1 refills | Status: DC

## 2017-10-10 MED ORDER — HYDROCODONE-HOMATROPINE 5-1.5 MG/5ML PO SYRP: 5.0000 mL | ORAL_SOLUTION | Freq: Three times a day (TID) | ORAL | 0 refills | Status: DC | PRN

## 2017-10-10 MED ORDER — PREDNISONE 10 MG PO TABS: ORAL_TABLET | ORAL | 0 refills | Status: DC

## 2017-10-10 MED ORDER — AZITHROMYCIN 250 MG PO TABS: ORAL_TABLET | ORAL | 0 refills | Status: DC

## 2017-10-10 NOTE — Patient Instructions (Addendum)
You appear to have bronchitis and sinusitis. Rest hydrate and tylenol for fever. I am prescribing cough medicine hycodan, and a azithromycin antibiotic. For your nasal congestion Rx Flonase  For wheezing and tight airways can use albuterol every 4-6 hours as needed.  If this is not adequate then add taper prednisone.  Your flu test was negative.  Follow up in 7-10 days or as needed

## 2017-10-10 NOTE — Progress Notes (Signed)
Subjective:    Patient ID: Roberta Farley, female    DOB: [birth year], 32 y.o.   MRN: [id number]  HPI  Pt in for cough since Saturday.  Pt states cough productive eventually after constant dry cough. Pt states airways feel tight. She takes albuterol and it does help a lot but then feels wheeze and tight again eventually later in the day.   Some moderate body aches this morning.  Patient notes intolerance to Tamiflu in the past.  Some sinus pain as well and nasal congestion.  LMP- September 17, 2017.   Review of Systems  Constitutional: Positive for fatigue and fever. Negative for chills.  HENT: Positive for congestion, postnasal drip and sinus pressure. Negative for sore throat.   Respiratory: Positive for cough and wheezing. Negative for choking, shortness of breath and stridor.   Cardiovascular: Negative for chest pain and palpitations.  Gastrointestinal: Negative for abdominal pain.  Musculoskeletal: Positive for myalgias. Negative for back pain and gait problem.       Mild muscle aches presently.  She states not flulike.  Neurological: Negative for dizziness, tremors, weakness, light-headedness and headaches.  Hematological: Negative for adenopathy. Does not bruise/bleed easily.  Psychiatric/Behavioral: Negative for behavioral problems and confusion.   Past Medical History:  Diagnosis Date  . Asthma   . Chlamydia IBS     Social History   Socioeconomic History  . Marital status: Single    Spouse name: Not on file  . Number of children: 1  . Years of education: Not on file  . Highest education level: Not on file  Social Needs  . Financial resource strain: Not on file  . Food insecurity - worry: Not on file  . Food insecurity - inability: Not on file  . Transportation needs - medical: Not on file  . Transportation needs - non-medical: Not on file  Occupational History  . Occupation: CNA  Tobacco Use  . Smoking status: Former Smoker    Last attempt  to quit: 06/11/2013    Years since quitting: 4.3  . Smokeless tobacco: Never Used  . Tobacco comment: "Hookah" tobacco    Substance and Sexual Activity  . Alcohol use: Yes    Comment: "socially"  . Drug use: No  . Sexual activity: Yes    Partners: Male    Birth control/protection: Injection, None  Other Topics Concern  . Not on file  Social History Narrative  . Not on file    Past Surgical History:  Procedure Laterality Date  . INDUCED ABORTION     x 2  . NO PAST SURGERIES      Family History  Problem Relation Age of Onset  . Hypertension Unknown   . Congestive Heart Failure Unknown   . Diabetes Unknown   . Ovarian cancer Maternal Grandmother   . Cancer Maternal Grandmother   . Diabetes Mother   . Hypertension Mother   . Hyperlipidemia Father   . Diabetes Father     Allergies  Allergen Reactions  . Other Rash    Strawberry/kiwi flavor only,  Not allergic to actual fruits    Current Outpatient Medications on File Prior to Visit  Medication Sig Dispense Refill  . albuterol (PROVENTIL HFA;VENTOLIN HFA) 108 (90 BASE) MCG/ACT inhaler Inhale 1-2 puffs into the lungs every 6 (six) hours as needed for wheezing or shortness of breath. 1 Inhaler 0  . albuterol (PROVENTIL HFA;VENTOLIN HFA) 108 (90 Base) MCG/ACT inhaler Inhale 2 puffs into the lungs every 6 (six)  hours as needed for wheezing or shortness of breath. 1 Inhaler 0  . meloxicam (MOBIC) 15 MG tablet Take 15 mg by mouth daily.    . Norgestimate-Ethinyl Estradiol Triphasic (TRI-LO-MARZIA) 0.18/0.215/0.25 MG-25 MCG tab Take 1 tablet by mouth daily.    Marland Kitchen amoxicillin (AMOXIL) 875 MG tablet Take 875 mg by mouth 2 (two) times daily.     No current facility-administered medications on file prior to visit.     BP 133/83 (BP Location: Left Arm, Patient Position: Sitting, Cuff Size: Small)   Pulse 73   Temp 98.9 F (37.2 C) (Oral)   Resp 16   Ht 5\' 9"  (1.753 m)   Wt 173 lb (78.5 kg)   LMP 09/17/2017   SpO2 100%    BMI 25.55 kg/m       Objective:   Physical Exam  General  Mental Status - Alert. General Appearance - Well groomed. Not in acute distress.  Skin Rashes- No Rashes.  HEENT Head- Normal. Ear Auditory Canal - Left- Normal. Right - Normal.Tympanic Membrane- Left- Normal. Right- Normal. Eye Sclera/Conjunctiva- Left- Normal. Right- Normal. Nose & Sinuses Nasal Mucosa- Left-  Boggy and Congested. Right-  Boggy and  Congested.Bilateral  No maxillary but left frontal sinus pressure. Mouth & Throat Lips: Upper Lip- Normal: no dryness, cracking, pallor, cyanosis, or vesicular eruption. Lower Lip-Normal: no dryness, cracking, pallor, cyanosis or vesicular eruption. Buccal Mucosa- Bilateral- No Aphthous ulcers. Oropharynx- No Discharge or Erythema. Tonsils: Characteristics- Bilateral- No Erythema or Congestion. Size/Enlargement- Bilateral- No enlargement. Discharge- bilateral-None.  Neck Neck- Supple. No Masses.   Chest and Lung Exam Auscultation: Breath Sounds:-Clear even and unlabored.  Cardiovascular Auscultation:Rythm- Regular, rate and rhythm. Murmurs & Other Heart Sounds:Ausculatation of the heart reveal- No Murmurs.  Lymphatic Head & Neck General Head & Neck Lymphatics: Bilateral: Description- No Localized lymphadenopathy.       Assessment & Plan:   You appear to have bronchitis and sinusitis. Rest hydrate and tylenol for fever. I am prescribing cough medicine hycodan, and a azithromycin antibiotic. For your nasal congestion Rx Flonase  For wheezing and tight airways can use albuterol every 4-6 hours as needed.  If this is not adequate then add taper prednisone.  Your flu test was negative.  Follow up in 7-10 days or as needed  [employer], [employer]

## 2017-11-01 ENCOUNTER — Other Ambulatory Visit: Payer: Self-pay | Admitting: Medical

## 2017-11-08 ENCOUNTER — Telehealth: Payer: Self-pay | Admitting: Medical

## 2017-11-08 NOTE — Telephone Encounter (Signed)
Patient requesting Zyrtec and Plan B pill, [facility] patient declined an appointment.  Pharmacy: [id number]- [place], [place] - [address] [phone] (Phone) [phone] (Fax)

## 2017-11-08 NOTE — Telephone Encounter (Signed)
Copied from CRM [id number]. Topic: Quick Communication - Rx Refill/Question >> Nov 08, 2017 12:53 PM Landry MellowFoltz, Melissa J wrote: Medication: zyrtec and plan b pill  Has the patient contacted their pharmacy? No. (Agent: If no, request that the patient contact the pharmacy for the refill.) Preferred Pharmacy (with phone number or street name): [address] [place]  Agent: Please be advised that RX refills may take up to 3 business days. We ask that you follow-up with your pharmacy. Pt aware of 72 hour turn around time. Offered appt, pt declined

## 2017-11-09 ENCOUNTER — Telehealth: Payer: Self-pay | Admitting: Medical

## 2017-11-09 MED ORDER — CETIRIZINE HCL 10 MG PO TABS: 10.0000 mg | ORAL_TABLET | Freq: Every day | ORAL | 0 refills | Status: DC

## 2017-11-09 NOTE — Telephone Encounter (Signed)
I have tried to call patient 3-4 times today but getting message that all circuits are busy.  Not sure why I am getting this message.  I did talk with our pharmacy and they state that plan b is not covered by insurance.  She can get that over-the-counter without a prescription.  I will go ahead and prescribe the Zyrtec.  I wanted to talk to patient directly and ask questions regarding when her last menstrual period was and how many days has passed since dates she was last had sex.  Since I cannot contact her directly would you just go ahead and let her know Plan B is over-the-counter.  Please update me directly what she says/send message back to me.

## 2017-11-09 NOTE — Telephone Encounter (Signed)
Patient called [phone], left detailed VM to return call to the office that Esperanza RichtersEdward Saguier wanted to speak to her directly about her request and that the zyrtec was sent to the pharmacy. Advised to call the office back as soon as she receives this message.

## 2017-11-09 NOTE — Telephone Encounter (Signed)
rx zyrtec sent to pt pharmacy.

## 2017-11-09 NOTE — Telephone Encounter (Signed)
Attempted to call patient at [phone], message says phone is no longer in service.

## 2017-11-10 NOTE — Telephone Encounter (Signed)
Thanks for calling. As stated plan b is over the counter. Suspect pharmacy already explained that to her if she picked up zyrtec.  Explain this to her if she calls again.

## 2017-12-10 ENCOUNTER — Other Ambulatory Visit: Payer: Self-pay | Admitting: Medical

## 2018-01-12 ENCOUNTER — Other Ambulatory Visit: Payer: Self-pay | Admitting: Medical

## 2018-03-31 DIAGNOSIS — J029 Acute pharyngitis, unspecified: Secondary | ICD-10-CM | POA: Diagnosis not present

## 2018-03-31 DIAGNOSIS — J0300 Acute streptococcal tonsillitis, unspecified: Secondary | ICD-10-CM | POA: Diagnosis not present

## 2018-03-31 DIAGNOSIS — R590 Localized enlarged lymph nodes: Secondary | ICD-10-CM | POA: Diagnosis not present

## 2018-06-22 DIAGNOSIS — Z01419 Encounter for gynecological examination (general) (routine) without abnormal findings: Secondary | ICD-10-CM | POA: Diagnosis not present

## 2018-06-22 DIAGNOSIS — N760 Acute vaginitis: Secondary | ICD-10-CM | POA: Diagnosis not present

## 2018-06-22 DIAGNOSIS — Z6826 Body mass index (BMI) 26.0-26.9, adult: Secondary | ICD-10-CM | POA: Diagnosis not present

## 2018-09-14 DIAGNOSIS — Z113 Encounter for screening for infections with a predominantly sexual mode of transmission: Secondary | ICD-10-CM | POA: Diagnosis not present

## 2018-09-14 DIAGNOSIS — N760 Acute vaginitis: Secondary | ICD-10-CM | POA: Diagnosis not present

## 2018-11-25 ENCOUNTER — Other Ambulatory Visit: Payer: Self-pay | Admitting: Medical

## 2019-05-23 ENCOUNTER — Ambulatory Visit (INDEPENDENT_AMBULATORY_CARE_PROVIDER_SITE_OTHER): Payer: BC Managed Care – PPO | Admitting: Family Medicine

## 2019-05-23 ENCOUNTER — Ambulatory Visit: Payer: BLUE CROSS/BLUE SHIELD | Admitting: Medical

## 2019-05-23 ENCOUNTER — Other Ambulatory Visit: Payer: Self-pay

## 2019-05-23 ENCOUNTER — Encounter: Payer: Self-pay | Admitting: Family Medicine

## 2019-05-23 VITALS — HR 88 | Temp 97.9°F | Ht 68.5 in | Wt 175.0 lb

## 2019-05-23 DIAGNOSIS — J4521 Mild intermittent asthma with (acute) exacerbation: Secondary | ICD-10-CM

## 2019-05-23 DIAGNOSIS — J4541 Moderate persistent asthma with (acute) exacerbation: Secondary | ICD-10-CM

## 2019-05-23 MED ORDER — PREDNISONE 10 MG PO TABS: ORAL_TABLET | ORAL | 0 refills | Status: DC

## 2019-05-23 MED ORDER — ALBUTEROL SULFATE HFA 108 (90 BASE) MCG/ACT IN AERS: 1.0000 | INHALATION_SPRAY | Freq: Four times a day (QID) | RESPIRATORY_TRACT | 3 refills | Status: DC | PRN

## 2019-05-23 MED ORDER — ALBUTEROL SULFATE (2.5 MG/3ML) 0.083% IN NEBU: 2.5000 mg | INHALATION_SOLUTION | Freq: Four times a day (QID) | RESPIRATORY_TRACT | 1 refills | Status: DC | PRN

## 2019-05-23 MED ORDER — FLUCONAZOLE 150 MG PO TABS: ORAL_TABLET | ORAL | 0 refills | Status: DC

## 2019-05-23 MED ORDER — AMOXICILLIN-POT CLAVULANATE 875-125 MG PO TABS: 1.0000 | ORAL_TABLET | Freq: Two times a day (BID) | ORAL | 0 refills | Status: DC

## 2019-05-23 NOTE — Progress Notes (Signed)
Virtual Visit via Video Note  I connected with Roberta Farley on 05/23/19 at  8:40 AM EDT by a video enabled telemedicine application and verified that I am speaking with the correct person using two identifiers.  Location: Patient: home  Provider: home    I discussed the limitations of evaluation and management by telemedicine and the availability of in person appointments. The patient expressed understanding and agreed to proceed.  History of Present Illness: Pt is home in her car ----  C/o nasal congestion and cold symptoms.  Her asthma is bothering --- pt is using rescue inhaler and her daughter neb more often.  She denies exposure to covid and has no fever  She also c/o urinary symptoms--- her urine has a strong odor  No back pain, no abd pain    Observations/Objective: Vitals:   05/23/19 0833  Pulse: 88  Temp: 97.9 F (36.6 C)  pt is in nad   Assessment and Plan: 1. Moderate persistent asthmatic bronchitis with acute exacerbation Pt asthma seems worsening to her and occurs same time every year Will rx augmentin due to urinary symptoms as well pred taper and refill albuterol F/u pcp 2 weeks  - amoxicillin-clavulanate (AUGMENTIN) 875-125 MG tablet; Take 1 tablet by mouth 2 (two) times daily.  Dispense: 20 tablet; Refill: 0 - fluconazole (DIFLUCAN) 150 MG tablet; 1 po x1, may repeat in 3 days prn  Dispense: 2 tablet; Refill: 0 - predniSONE (DELTASONE) 10 MG tablet; TAKE 3 TABLETS PO QD FOR 3 DAYS THEN TAKE 2 TABLETS PO QD FOR 3 DAYS THEN TAKE 1 TABLET PO QD FOR 3 DAYS THEN TAKE 1/2 TAB PO QD FOR 3 DAYS  Dispense: 20 tablet; Refill: 0 - albuterol (PROVENTIL) (2.5 MG/3ML) 0.083% nebulizer solution; Take 3 mLs (2.5 mg total) by nebulization every 6 (six) hours as needed for wheezing or shortness of breath.  Dispense: 150 mL; Refill: 1  2. Mild intermittent asthma with acute exacerbation - albuterol (VENTOLIN HFA) 108 (90 Base) MCG/ACT inhaler; Inhale 1-2 puffs into  the lungs every 6 (six) hours as needed for wheezing or shortness of breath.  Dispense: 18 g; Refill: 3   Follow Up Instructions:    I discussed the assessment and treatment plan with the patient. The patient was provided an opportunity to ask questions and all were answered. The patient agreed with the plan and demonstrated an understanding of the instructions.   The patient was advised to call back or seek an in-person evaluation if the symptoms worsen or if the condition fails to improve as anticipated.  I provided 15 minutes of non-face-to-face time during this encounter.   Ann Held, DO

## 2019-07-02 DIAGNOSIS — Z20828 Contact with and (suspected) exposure to other viral communicable diseases: Secondary | ICD-10-CM | POA: Diagnosis not present

## 2019-07-30 ENCOUNTER — Telehealth: Payer: BC Managed Care – PPO

## 2019-08-01 DIAGNOSIS — N760 Acute vaginitis: Secondary | ICD-10-CM | POA: Diagnosis not present

## 2019-08-01 DIAGNOSIS — Z3040 Encounter for surveillance of contraceptives, unspecified: Secondary | ICD-10-CM | POA: Diagnosis not present

## 2019-08-01 DIAGNOSIS — Z113 Encounter for screening for infections with a predominantly sexual mode of transmission: Secondary | ICD-10-CM | POA: Diagnosis not present

## 2019-10-16 DIAGNOSIS — J45909 Unspecified asthma, uncomplicated: Secondary | ICD-10-CM | POA: Diagnosis not present

## 2019-10-16 DIAGNOSIS — Z01419 Encounter for gynecological examination (general) (routine) without abnormal findings: Secondary | ICD-10-CM | POA: Diagnosis not present

## 2019-10-16 DIAGNOSIS — Z6828 Body mass index (BMI) 28.0-28.9, adult: Secondary | ICD-10-CM | POA: Diagnosis not present

## 2019-12-19 DIAGNOSIS — R895 Abnormal microbiological findings in specimens from other organs, systems and tissues: Secondary | ICD-10-CM | POA: Diagnosis not present

## 2019-12-19 DIAGNOSIS — Z113 Encounter for screening for infections with a predominantly sexual mode of transmission: Secondary | ICD-10-CM | POA: Diagnosis not present

## 2020-01-16 ENCOUNTER — Encounter: Payer: BC Managed Care – PPO | Admitting: Medical

## 2020-01-25 ENCOUNTER — Other Ambulatory Visit: Payer: Self-pay

## 2020-01-25 ENCOUNTER — Ambulatory Visit: Payer: BC Managed Care – PPO | Admitting: Nurse Practitioner

## 2020-01-25 ENCOUNTER — Other Ambulatory Visit (INDEPENDENT_AMBULATORY_CARE_PROVIDER_SITE_OTHER): Payer: BC Managed Care – PPO

## 2020-01-25 ENCOUNTER — Encounter: Payer: Self-pay | Admitting: Nurse Practitioner

## 2020-01-25 VITALS — BP 122/80 | HR 90 | Temp 97.7°F | Ht 69.0 in | Wt 187.0 lb

## 2020-01-25 DIAGNOSIS — J4520 Mild intermittent asthma, uncomplicated: Secondary | ICD-10-CM | POA: Insufficient documentation

## 2020-01-25 DIAGNOSIS — K581 Irritable bowel syndrome with constipation: Secondary | ICD-10-CM

## 2020-01-25 DIAGNOSIS — J309 Allergic rhinitis, unspecified: Secondary | ICD-10-CM | POA: Diagnosis not present

## 2020-01-25 LAB — FECAL OCCULT BLOOD, IMMUNOCHEMICAL: Fecal Occult Bld: NEGATIVE

## 2020-01-25 NOTE — Assessment & Plan Note (Signed)
She reports a long history of IBS-C.  Says she will have a bowel movement about every 3 days.  She tends to get bloated and uncomfortable if she goes 4 days per 3 or 4 days.  She does take a over-the-counter natural herbal remedy with Senokot and that works very well.  She has minimal crampiness with dairy.  She is now lactose free and doing better.  No abdominal pain, nausea vomiting weight loss.  She has noted no blood or melena stool.  I have ordered Hemoccult cards.  Obtain routine laboratory studies to check thyroid.  Referral to GI as needed-she declines at this time.

## 2020-01-25 NOTE — Progress Notes (Signed)
+Established Patient Office Visit  Subjective:  Patient ID: Roberta Farley, female    DOB: 26-Feb-1986  Age: 34 y.o. MRN: [id number]  CC:  Chief Complaint  Patient presents with  . Transitions Of Care    discuss allergy shots/referral to allergist    HPI Roberta Farley presents to establish care with a new provider and requests for referral to allergist.  Allergic rhinitis asthma: Patient reports she has had allergy symptoms for a very long time and she does fairly well.  She would like to have allergy testing.  She has never seen ENT or an allergist.  She presents without any current flares taking Zyrtec, Flonase, Albuterol as needed.d   IBS-C: She has had this for years and no current concerns. If she does not have a  BM for several days, she gets bloated.  Linzess has been offered in the past.  She does not want to take prescription medication.  She wants to control it naturally -and she is taking an over-the-counter product -Senekot, and other ingredients.  She gets occasional gas crampiness but that is much improved since she went off dairy.  She has no abdominal pain, does not see blood or melena stool,  no weight loss, no nausea vomiting.  No upper GI complaints.  Past Medical History:  Diagnosis Date  . Asthma   . Chlamydia IBS    Past Surgical History:  Procedure Laterality Date  . INDUCED ABORTION     x 2  . NO PAST SURGERIES      Family History  Problem Relation Age of Onset  . Hypertension Unknown   . Congestive Heart Failure Unknown   . Diabetes Unknown   . Ovarian cancer Maternal Grandmother   . Cancer Maternal Grandmother   . Diabetes Mother   . Hypertension Mother   . Hyperlipidemia Father   . Diabetes Father     Social History   Socioeconomic History  . Marital status: Single    Spouse name: Not on file  . Number of children: 1  . Years of education: Not on file  . Highest education level: Not on file  Occupational  History  . Occupation: CNA  Tobacco Use  . Smoking status: Former Smoker    Quit date: 06/11/2013    Years since quitting: 6.6  . Smokeless tobacco: Never Used  . Tobacco comment: "Hookah" tobacco    Substance and Sexual Activity  . Alcohol use: Yes    Comment: "socially"  . Drug use: No  . Sexual activity: Yes    Partners: Male    Birth control/protection: Injection, None  Other Topics Concern  . Not on file  Social History Narrative  . Not on file   Social Determinants of Health   Financial Resource Strain:   . Difficulty of Paying Living Expenses:   Food Insecurity:   . Worried About Programme researcher, broadcasting/film/video in the Last Year:   . Barista in the Last Year:   Transportation Needs:   . Freight forwarder (Medical):   Marland Kitchen Lack of Transportation (Non-Medical):   Physical Activity:   . Days of Exercise per Week:   . Minutes of Exercise per Session:   Stress:   . Feeling of Stress :   Social Connections:   . Frequency of Communication with Friends and Family:   . Frequency of Social Gatherings with Friends and Family:   . Attends Religious Services:   . Active Member of  Clubs or Organizations:   . Attends Banker Meetings:   Marland Kitchen Marital Status:   Intimate Partner Violence:   . Fear of Current or Ex-Partner:   . Emotionally Abused:   Marland Kitchen Physically Abused:   . Sexually Abused:     Outpatient Medications Prior to Visit  Medication Sig Dispense Refill  . albuterol (PROVENTIL HFA;VENTOLIN HFA) 108 (90 Base) MCG/ACT inhaler Inhale 2 puffs into the lungs every 6 (six) hours as needed for wheezing or shortness of breath. 1 Inhaler 0  . albuterol (VENTOLIN HFA) 108 (90 Base) MCG/ACT inhaler Inhale 1-2 puffs into the lungs every 6 (six) hours as needed for wheezing or shortness of breath. 18 g 3  . cetirizine (ZYRTEC) 10 MG tablet TAKE 1 TABLET BY MOUTH EVERY DAY 30 tablet 0  . COLLAGEN PO Take by mouth.    . fluticasone (FLONASE) 50 MCG/ACT nasal spray SPRAY 2  SPRAYS INTO EACH NOSTRIL EVERY DAY 16 g 1  . JUNEL FE 1/20 1-20 MG-MCG tablet TAKE 1 TABLET BY MOUTH EVERY DAY, CONTINUOUS, OPEN NEW PATCK EVERY 21 DAYS    . Probiotic Product (PROBIOTIC PO) Take by mouth.    Marland Kitchen albuterol (PROVENTIL) (2.5 MG/3ML) 0.083% nebulizer solution Take 3 mLs (2.5 mg total) by nebulization every 6 (six) hours as needed for wheezing or shortness of breath. 150 mL 1  . amoxicillin-clavulanate (AUGMENTIN) 875-125 MG tablet Take 1 tablet by mouth 2 (two) times daily. 20 tablet 0  . fluconazole (DIFLUCAN) 150 MG tablet 1 po x1, may repeat in 3 days prn 2 tablet 0  . meloxicam (MOBIC) 15 MG tablet Take 15 mg by mouth daily.    . Norgestimate-Ethinyl Estradiol Triphasic (TRI-LO-MARZIA) 0.18/0.215/0.25 MG-25 MCG tab Take 1 tablet by mouth daily.    . predniSONE (DELTASONE) 10 MG tablet TAKE 3 TABLETS PO QD FOR 3 DAYS THEN TAKE 2 TABLETS PO QD FOR 3 DAYS THEN TAKE 1 TABLET PO QD FOR 3 DAYS THEN TAKE 1/2 TAB PO QD FOR 3 DAYS 20 tablet 0   No facility-administered medications prior to visit.    No Known Allergies  ROS Review of Systems  Constitutional: Negative.   HENT: Negative.   Eyes: Negative.   Respiratory: Negative.   Cardiovascular: Negative.   Gastrointestinal: Positive for constipation.  Endocrine: Negative.   Musculoskeletal: Negative.   Skin: Negative.   Allergic/Immunologic: Positive for environmental allergies.  Neurological: Negative.   Psychiatric/Behavioral: Negative.       Objective:    Physical Exam Vitals reviewed.  Constitutional:      Appearance: Normal appearance.  HENT:     Head: Normocephalic and atraumatic.  Cardiovascular:     Rate and Rhythm: Normal rate and regular rhythm.     Pulses: Normal pulses.     Heart sounds: Normal heart sounds.  Pulmonary:     Effort: Pulmonary effort is normal.     Breath sounds: Normal breath sounds.  Abdominal:     Palpations: Abdomen is soft.     Tenderness: There is no abdominal tenderness.    Musculoskeletal:        General: Normal range of motion.     Cervical back: Normal range of motion and neck supple.  Skin:    General: Skin is warm and dry.  Neurological:     General: No focal deficit present.     Mental Status: She is alert and oriented to person, place, and time.  Psychiatric:        Mood  and Affect: Mood normal.        Behavior: Behavior normal.     BP 122/80 (BP Location: Left Arm, Patient Position: Sitting, Cuff Size: Normal)   Pulse 90   Temp 97.7 F (36.5 C) (Skin)   Ht 5\' 9"  (1.753 m)   Wt 187 lb (84.8 kg)   SpO2 98%   BMI 27.62 kg/m  Wt Readings from Last 3 Encounters:  01/25/20 187 lb (84.8 kg)  05/23/19 175 lb (79.4 kg)  10/10/17 173 lb (78.5 kg)     Health Maintenance Due  Topic Date Due  . Hepatitis C Screening  Never done  . COVID-19 Vaccine (1) Never done  . PAP SMEAR-Modifier  06/01/2016    There are no preventive care reminders to display for this patient.  Lab Results  Component Value Date   TSH 0.83 11/01/2016   Lab Results  Component Value Date   WBC 6.8 11/01/2016   HGB 13.7 11/01/2016   HCT 41.3 11/01/2016   MCV 98.6 11/01/2016   PLT 269.0 11/01/2016   Lab Results  Component Value Date   NA 139 11/01/2016   K 3.9 11/01/2016   CO2 28 11/01/2016   GLUCOSE 98 11/01/2016   BUN 11 11/01/2016   CREATININE 0.82 11/01/2016   BILITOT 0.4 11/01/2016   ALKPHOS 30 (L) 11/01/2016   AST 15 11/01/2016   ALT 11 11/01/2016   PROT 7.3 11/01/2016   ALBUMIN 4.3 11/01/2016   CALCIUM 9.5 11/01/2016   ANIONGAP 8 11/08/2015   GFR 103.34 11/01/2016   Lab Results  Component Value Date   CHOL 115 11/01/2016   Lab Results  Component Value Date   HDL 50.50 11/01/2016   Lab Results  Component Value Date   LDLCALC 47 11/01/2016   Lab Results  Component Value Date   TRIG 86.0 11/01/2016   Lab Results  Component Value Date   CHOLHDL 2 11/01/2016   Lab Results  Component Value Date   HGBA1C 5.6 06/11/2013      Assessment & Plan:   Problem List Items Addressed This Visit      Respiratory   Allergic rhinitis - Primary    Referral to ENT Dr. 13/05/2013.  She has had a long history of significant allergies.  Intermittent asthma seasonal.  Chronic problem flare.  She is taking her chronic medications.      Relevant Orders   Ambulatory referral to Allergy   Mild intermittent asthma without complication   Relevant Orders   Ambulatory referral to Allergy     Digestive   IBS    She reports a long history of IBS-C.  Says she will have a bowel movement about every 3 days.  She tends to get bloated and uncomfortable if she goes 4 days per 3 or 4 days.  She does take a over-the-counter natural herbal remedy with Senokot and that works very well.  She has minimal crampiness with dairy.  She is now lactose free and doing better.  No abdominal pain, nausea vomiting weight loss.  She has noted no blood or melena stool.  I have ordered Hemoccult cards.  Obtain routine laboratory studies to check thyroid.  Referral to GI as needed-she declines at this time.      Relevant Medications   Probiotic Product (PROBIOTIC PO)   Other Relevant Orders   Fecal occult blood, imunochemical     No orders of the defined types were placed in this encounter.  For your irritable bowel with  constipation you can try taking Colace which is also called docusate sodium.  This is a stool softener.  Some people have very good bowel movement simply taking 1 or 2 stool softeners a day.  This does not have a laxative in it.  You may take your over-the-counter Senekot herbal medication 1 every other day or every 2 days and see if that gives you more of a regular bowel movement.  We discussed Linzess and MiraLAX as other options.  MiraLAX is sold over-the-counter.  I have placed a referral to the allergist for your allergic rhinitis and asthma.  This appointment will be made in [place].  You will get a call to make an appointment.  Let  me know if you have not heard anything in 2 weeks.  Follow-up office visit in 2 months to do complete physical exam.  We can get routine laboratory studies at that time.  Follow-up: No follow-ups on file.   This visit occurred during the SARS-CoV-2 public health emergency.  Safety protocols were in place, including screening questions prior to the visit, additional usage of staff PPE, and extensive cleaning of exam room while observing appropriate contact time as indicated for disinfecting solutions.   Denice Paradise, NP

## 2020-01-25 NOTE — Assessment & Plan Note (Signed)
Referral to ENT Dr. Jenne Campus.  She has had a long history of significant allergies.  Intermittent asthma seasonal.  Chronic problem flare.  She is taking her chronic medications.

## 2020-01-25 NOTE — Patient Instructions (Addendum)
For your irritable bowel with constipation you can try taking Colace which is also called docusate sodium.  This is a stool softener.  Some people have very good bowel movement simply taking 1 or 2 stool softeners a day.  This does not have a laxative in it.  You may take your over-the-counter Senekot herbal medication 1 every other day or every 2 days and see if that gives you more of a regular bowel movement.  We discussed Linzess and MiraLAX as other options.  MiraLAX is sold over-the-counter.  I have placed a referral to the allergist for your allergic rhinitis and asthma.  This appointment will be made in [place].  You will get a call to make an appointment.  Let me know if you have not heard anything in 2 weeks.  Follow-up office visit in 2 months to do complete physical exam.  We can get routine laboratory studies at that time.   Irritable Bowel Syndrome, Adult  Irritable bowel syndrome (IBS) is a group of symptoms that affects the organs responsible for digestion (gastrointestinal or GI tract). IBS is not one specific disease. To regulate how the GI tract works, the body sends signals back and forth between the intestines and the brain. If you have IBS, there may be a problem with these signals. As a result, the GI tract does not function normally. The intestines may become more sensitive and overreact to certain things. This may be especially true when you eat certain foods or when you are under stress. There are four types of IBS. These may be determined based on the consistency of your stool (feces):  IBS with diarrhea.  IBS with constipation.  Mixed IBS.  Unsubtyped IBS. It is important to know which type of IBS you have. Certain treatments are more likely to be helpful for certain types of IBS. What are the causes? The exact cause of IBS is not known. What increases the risk? You may have a higher risk for IBS if you:  Are female.  Are younger than 56.  Have a family  history of IBS.  Have a mental health condition, such as depression, anxiety, or post-traumatic stress disorder.  Have had a bacterial infection of your GI tract. What are the signs or symptoms? Symptoms of IBS vary from person to person. The main symptom is abdominal pain or discomfort. Other symptoms usually include one or more of the following:  Diarrhea, constipation, or both.  Abdominal swelling or bloating.  Feeling full after eating a small or regular-sized meal.  Frequent gas.  Mucus in the stool.  A feeling of having more stool left after a bowel movement. Symptoms tend to come and go. They may be triggered by stress, mental health conditions, or certain foods. How is this diagnosed? This condition may be diagnosed based on a physical exam, your medical history, and your symptoms. You may have tests, such as:  Blood tests.  Stool test.  X-rays.  CT scan.  Colonoscopy. This is a procedure in which your GI tract is viewed with a long, thin, flexible tube. How is this treated? There is no cure for IBS, but treatment can help relieve symptoms. Treatment depends on the type of IBS you have, and may include:  Changes to your diet, such as: ? Avoiding foods that cause symptoms. ? Drinking more water. ? Following a low-FODMAP (fermentable oligosaccharides, disaccharides, monosaccharides, and polyols) diet for up to 6 weeks, or as told by your health care provider. FODMAPs  are sugars that are hard for some people to digest. ? Eating more fiber. ? Eating medium-sized meals at the same times every day.  Medicines. These may include: ? Fiber supplements, if you have constipation. ? Medicine to control diarrhea (antidiarrheal medicines). ? Medicine to help control muscle tightening (spasms) in your GI tract (antispasmodic medicines). ? Medicines to help with mental health conditions, such as antidepressants or tranquilizers.  Talk therapy or counseling.  Working with a  diet and nutrition specialist (dietitian) to help create a food plan that is right for you.  Managing your stress. Follow these instructions at home: Eating and drinking  Eat a healthy diet.  Eat medium-sized meals at about the same time every day. Do not eat large meals.  Gradually eat more fiber-rich foods. These include whole grains, fruits, and vegetables. This may be especially helpful if you have IBS with constipation.  Eat a diet low in FODMAPs.  Drink enough fluid to keep your urine pale yellow.  Keep a journal of foods that seem to trigger symptoms.  Avoid foods and drinks that: ? Contain added sugar. ? Make your symptoms worse. Dairy products, caffeinated drinks, and carbonated drinks can make symptoms worse for some people. General instructions  Take over-the-counter and prescription medicines and supplements only as told by your health care provider.  Get enough exercise. Do at least 150 minutes of moderate-intensity exercise each week.  Manage your stress. Getting enough sleep and exercise can help you manage stress.  Keep all follow-up visits as told by your health care provider and therapist. This is important. Alcohol Use  Do not drink alcohol if: ? Your health care provider tells you not to drink. ? You are pregnant, may be pregnant, or are planning to become pregnant.  If you drink alcohol, limit how much you have: ? 0-1 drink a day for women. ? 0-2 drinks a day for men.  Be aware of how much alcohol is in your drink. In the [place] one drink equals one typical bottle of beer (12 oz), one-half glass of wine (5 oz), or one shot of hard liquor (1 oz). Contact a health care provider if you have:  Constant pain.  Weight loss.  Difficulty or pain when swallowing.  Diarrhea that gets worse. Get help right away if you have:  Severe abdominal pain.  Fever.  Diarrhea with symptoms of dehydration, such as dizziness or dry mouth.  Bright red blood in  your stool.  Stool that is black and tarry.  Abdominal swelling.  Vomiting that does not stop.  Blood in your vomit. Summary  Irritable bowel syndrome (IBS) is not one specific disease. It is a group of symptoms that affects digestion.  Your intestines may become more sensitive and overreact to certain things. This may be especially true when you eat certain foods or when you are under stress.  There is no cure for IBS, but treatment can help relieve symptoms. This information is not intended to replace advice given to you by your health care provider. Make sure you discuss any questions you have with your health care provider. Document Revised: 07/12/2017 Document Reviewed: 07/12/2017 Elsevier Patient Education  2020 [employer] Allergic Rhinitis, Adult Allergic rhinitis is an allergic reaction that affects the mucous membrane inside the nose. It causes sneezing, a runny or stuffy nose, and the feeling of mucus going down the back of the throat (postnasal drip). Allergic rhinitis can be mild to severe. There are two types of allergic  rhinitis:  Seasonal. This type is also called hay fever. It happens only during certain seasons.  Perennial. This type can happen at any time of the year. What are the causes? This condition happens when the body's defense system (immune system) responds to certain harmless substances called allergens as though they were germs.  Seasonal allergic rhinitis is triggered by pollen, which can come from grasses, trees, and weeds. Perennial allergic rhinitis may be caused by:  House dust mites.  Pet dander.  Mold spores. What are the signs or symptoms? Symptoms of this condition include:  Sneezing.  Runny or stuffy nose (nasal congestion).  Postnasal drip.  Itchy nose.  Tearing of the eyes.  Trouble sleeping.  Daytime sleepiness. How is this diagnosed? This condition may be diagnosed based on:  Your medical history.  A physical  exam.  Tests to check for related conditions, such as: ? Asthma. ? Pink eye. ? Ear infection. ? Upper respiratory infection.  Tests to find out which allergens trigger your symptoms. These may include skin or blood tests. How is this treated? There is no cure for this condition, but treatment can help control symptoms. Treatment may include:  Taking medicines that block allergy symptoms, such as antihistamines. Medicine may be given as a shot, nasal spray, or pill.  Avoiding the allergen.  Desensitization. This treatment involves getting ongoing shots until your body becomes less sensitive to the allergen. This treatment may be done if other treatments do not help.  If taking medicine and avoiding the allergen does not work, new, stronger medicines may be prescribed. Follow these instructions at home:  Find out what you are allergic to. Common allergens include smoke, dust, and pollen.  Avoid the things you are allergic to. These are some things you can do to help avoid allergens: ? Replace carpet with wood, tile, or vinyl flooring. Carpet can trap dander and dust. ? Do not smoke. Do not allow smoking in your home. ? Change your heating and air conditioning filter at least once a month. ? During allergy season:  Keep windows closed as much as possible.  Plan outdoor activities when pollen counts are lowest. This is usually during the evening hours.  When coming indoors, change clothing and shower before sitting on furniture or bedding.  Take over-the-counter and prescription medicines only as told by your health care provider.  Keep all follow-up visits as told by your health care provider. This is important. Contact a health care provider if:  You have a fever.  You develop a persistent cough.  You make whistling sounds when you breathe (you wheeze).  Your symptoms interfere with your normal daily activities. Get help right away if:  You have shortness of  breath. Summary  This condition can be managed by taking medicines as directed and avoiding allergens.  Contact your health care provider if you develop a persistent cough or fever.  During allergy season, keep windows closed as much as possible. This information is not intended to replace advice given to you by your health care provider. Make sure you discuss any questions you have with your health care provider. Document Revised: 07/01/2017 Document Reviewed: 08/26/2016 Elsevier Patient Education  2020 [employer].  http://www.aaaai.org/conditions-and-treatments/asthma">  Asthma, Adult  Asthma is a long-term (chronic) condition that causes recurrent episodes in which the airways become tight and narrow. The airways are the passages that lead from the nose and mouth down into the lungs. Asthma episodes, also called asthma attacks, can cause coughing,  wheezing, shortness of breath, and chest pain. The airways can also fill with mucus. During an attack, it can be difficult to breathe. Asthma attacks can range from minor to life threatening. Asthma cannot be cured, but medicines and lifestyle changes can help control it and treat acute attacks. What are the causes? This condition is believed to be caused by inherited (genetic) and environmental factors, but its exact cause is not known. There are many things that can bring on an asthma attack or make asthma symptoms worse (triggers). Asthma triggers are different for each person. Common triggers include:  Mold.  Dust.  Cigarette smoke.  Cockroaches.  Things that can cause allergy symptoms (allergens), such as animal dander or pollen from trees or grass.  Air pollutants such as household cleaners, wood smoke, smog, or Therapist, occupational.  Cold air, weather changes, and winds (which increase molds and pollen in the air).  Strong emotional expressions such as crying or laughing hard.  Stress.  Certain medicines (such as aspirin) or  types of medicines (such as beta-blockers).  Sulfites in foods and drinks. Foods and drinks that may contain sulfites include dried fruit, potato chips, and sparkling grape juice.  Infections or inflammatory conditions such as the flu, a cold, or inflammation of the nasal membranes (rhinitis).  Gastroesophageal reflux disease (GERD).  Exercise or strenuous activity. What are the signs or symptoms? Symptoms of this condition may occur right after asthma is triggered or many hours later. Symptoms include:  Wheezing. This can sound like whistling when you breathe.  Excessive nighttime or early morning coughing.  Frequent or severe coughing with a common cold.  Chest tightness.  Shortness of breath.  Tiredness (fatigue) with minimal activity. How is this diagnosed? This condition is diagnosed based on:  Your medical history.  A physical exam.  Tests, which may include: ? Lung function studies and pulmonary studies (spirometry). These tests can evaluate the flow of air in your lungs. ? Allergy tests. ? Imaging tests, such as X-rays. How is this treated? There is no cure for this condition, but treatment can help control your symptoms. Treatment for asthma usually involves:  Identifying and avoiding your asthma triggers.  Using medicines to control your symptoms. Generally, two types of medicines are used to treat asthma: ? Controller medicines. These help prevent asthma symptoms from occurring. They are usually taken every day. ? Fast-acting reliever or rescue medicines. These quickly relieve asthma symptoms by widening the narrow and tight airways. They are used as needed and provide short-term relief.  Using supplemental oxygen. This may be needed during a severe episode.  Using other medicines, such as: ? Allergy medicines, such as antihistamines, if your asthma attacks are triggered by allergens. ? Immune medicines (immunomodulators). These are medicines that help  control the immune system.  Creating an asthma action plan. An asthma action plan is a written plan for managing and treating your asthma attacks. This plan includes: ? A list of your asthma triggers and how to avoid them. ? Information about when medicines should be taken and when their dosage should be changed. ? Instructions about using a device called a peak flow meter. A peak flow meter measures how well the lungs are working and the severity of your asthma. It helps you monitor your condition. Follow these instructions at home: Controlling your home environment Control your home environment in the following ways to help avoid triggers and prevent asthma attacks:  Change your heating and air conditioning filter regularly.  Limit your use of fireplaces and wood stoves.  Get rid of pests (such as roaches and mice) and their droppings.  Throw away plants if you see mold on them.  Clean floors and dust surfaces regularly. Use unscented cleaning products.  Try to have someone else vacuum for you regularly. Stay out of rooms while they are being vacuumed and for a short while afterward. If you vacuum, use a dust mask from a hardware store, a double-layered or microfilter vacuum cleaner bag, or a vacuum cleaner with a HEPA filter.  Replace carpet with wood, tile, or vinyl flooring. Carpet can trap dander and dust.  Use allergy-proof pillows, mattress covers, and box spring covers.  Keep your bedroom a trigger-free room.  Avoid pets and keep windows closed when allergens are in the air.  Wash beddings every week in hot water and dry them in a dryer.  Use blankets that are made of polyester or cotton.  Clean bathrooms and kitchens with bleach. If possible, have someone repaint the walls in these rooms with mold-resistant paint. Stay out of the rooms that are being cleaned and painted.  Wash your hands often with soap and water. If soap and water are not available, use hand  sanitizer.  Do not allow anyone to smoke in your home. General instructions  Take over-the-counter and prescription medicines only as told by your health care provider. ? Speak with your health care provider if you have questions about how or when to take the medicines. ? Make note if you are requiring more frequent dosages.  Do not use any products that contain nicotine or tobacco, such as cigarettes and e-cigarettes. If you need help quitting, ask your health care provider. Also, avoid being exposed to secondhand smoke.  Use a peak flow meter as told by your health care provider. Record and keep track of the readings.  Understand and use the asthma action plan to help minimize, or stop an asthma attack, without needing to seek medical care.  Make sure you stay up to date on your yearly vaccinations as told by your health care provider. This may include vaccines for the flu and pneumonia.  Avoid outdoor activities when allergen counts are high and when air quality is low.  Wear a ski mask that covers your nose and mouth during outdoor winter activities. Exercise indoors on cold days if you can.  Warm up before exercising, and take time for a cool-down period after exercise.  Keep all follow-up visits as told by your health care provider. This is important. Where to find more information  For information about asthma, turn to the Centers for Disease Control and Prevention at http://www.clark.net/.htm  For air quality information, turn to [employer] at [employer].nl Contact a health care provider if:  You have wheezing, shortness of breath, or a cough even while you are taking medicine to prevent attacks.  The mucus you cough up (sputum) is thicker than usual.  Your sputum changes from clear or white to yellow, green, gray, or bloody.  Your medicines are causing side effects, such as a rash, itching, swelling, or trouble breathing.  You need to use a reliever medicine more  than 2-3 times a week.  Your peak flow reading is still at 50-79% of your personal best after following your action plan for 1 hour.  You have a fever. Get help right away if:  You are getting worse and do not respond to treatment during an asthma attack.  You are short  of breath when at rest or when doing very little physical activity.  You have difficulty eating, drinking, or talking.  You have chest pain or tightness.  You develop a fast heartbeat or palpitations.  You have a bluish color to your lips or fingernails.  You are light-headed or dizzy, or you faint.  Your peak flow reading is less than 50% of your personal best.  You feel too tired to breathe normally. Summary  Asthma is a long-term (chronic) condition that causes recurrent episodes in which the airways become tight and narrow. These episodes can cause coughing, wheezing, shortness of breath, and chest pain.  Asthma cannot be cured, but medicines and lifestyle changes can help control it and treat acute attacks.  Make sure you understand how to avoid triggers and how and when to use your medicines.  Asthma attacks can range from minor to life threatening. Get help right away if you have an asthma attack and do not respond to treatment with your usual rescue medicines. This information is not intended to replace advice given to you by your health care provider. Make sure you discuss any questions you have with your health care provider. Document Revised: 09/21/2018 Document Reviewed: 08/23/2016 Elsevier Patient Education  2020 [employer].

## 2020-02-06 ENCOUNTER — Encounter: Payer: Self-pay | Admitting: Nurse Practitioner

## 2020-02-06 ENCOUNTER — Ambulatory Visit (INDEPENDENT_AMBULATORY_CARE_PROVIDER_SITE_OTHER): Payer: BC Managed Care – PPO | Admitting: Nurse Practitioner

## 2020-02-06 ENCOUNTER — Other Ambulatory Visit: Payer: Self-pay

## 2020-02-06 VITALS — BP 120/70 | HR 87 | Temp 98.4°F | Ht 69.0 in | Wt 190.0 lb

## 2020-02-06 DIAGNOSIS — Z0000 Encounter for general adult medical examination without abnormal findings: Secondary | ICD-10-CM

## 2020-02-06 LAB — COMPREHENSIVE METABOLIC PANEL
ALT: 13 U/L (ref 0–35)
AST: 18 U/L (ref 0–37)
Albumin: 4.3 g/dL (ref 3.5–5.2)
Alkaline Phosphatase: 27 U/L — ABNORMAL LOW (ref 39–117)
BUN: 10 mg/dL (ref 6–23)
CO2: 26 mEq/L (ref 19–32)
Calcium: 9.1 mg/dL (ref 8.4–10.5)
Chloride: 105 mEq/L (ref 96–112)
Creatinine, Ser: 0.74 mg/dL (ref 0.40–1.20)
GFR: 107.38 mL/min (ref >60.00)
Glucose, Bld: 98 mg/dL (ref 70–99)
Potassium: 4.1 mEq/L (ref 3.5–5.1)
Sodium: 136 mEq/L (ref 135–145)
Total Bilirubin: 0.4 mg/dL (ref 0.2–1.2)
Total Protein: 7 g/dL (ref 6.0–8.3)

## 2020-02-06 LAB — CBC WITH DIFFERENTIAL/PLATELET
Basophils Absolute: 0 10*3/uL (ref 0.0–0.1)
Basophils Relative: 0.7 % (ref 0.0–3.0)
Eosinophils Absolute: 0.2 10*3/uL (ref 0.0–0.7)
Eosinophils Relative: 3.1 % (ref 0.0–5.0)
HCT: 40 % (ref 36.0–46.0)
Hemoglobin: 13.3 g/dL (ref 12.0–15.0)
Lymphocytes Relative: 41.3 % (ref 12.0–46.0)
Lymphs Abs: 2.2 10*3/uL (ref 0.7–4.0)
MCHC: 33.2 g/dL (ref 30.0–36.0)
MCV: 99.2 fl (ref 78.0–100.0)
Monocytes Absolute: 0.3 10*3/uL (ref 0.1–1.0)
Monocytes Relative: 5.6 % (ref 3.0–12.0)
Neutro Abs: 2.6 10*3/uL (ref 1.4–7.7)
Neutrophils Relative %: 49.3 % (ref 43.0–77.0)
Platelets: 240 10*3/uL (ref 150.0–400.0)
RBC: 4.04 Mil/uL (ref 3.87–5.11)
RDW: 12.3 % (ref 11.5–15.5)
WBC: 5.2 10*3/uL (ref 4.0–10.5)

## 2020-02-06 LAB — LIPID PANEL
Cholesterol: 128 mg/dL (ref 0–200)
HDL: 54.5 mg/dL (ref >39.00)
LDL Cholesterol: 59 mg/dL (ref 0–99)
NonHDL: 73.58
Total CHOL/HDL Ratio: 2
Triglycerides: 74 mg/dL (ref 0.0–149.0)
VLDL: 14.8 mg/dL (ref 0.0–40.0)

## 2020-02-06 LAB — TSH: TSH: 0.66 u[IU]/mL (ref 0.35–4.50)

## 2020-02-06 LAB — VITAMIN D 25 HYDROXY (VIT D DEFICIENCY, FRACTURES): VITD: 22.5 ng/mL — ABNORMAL LOW (ref 30.00–100.00)

## 2020-02-06 LAB — HEMOGLOBIN A1C: Hgb A1c MFr Bld: 5.7 % (ref 4.6–6.5)

## 2020-02-06 NOTE — Patient Instructions (Addendum)
We have completed your school physical  paperwork.  Please go to the lab for routine adult CPE labs and will call you with results.   I recommend that you get the COVID vaccine.   Office visit in 6 months or sooner if needed.    Preventive Care 36-36 Years Old, Female Preventive care refers to visits with your health care provider and lifestyle choices that can promote health and wellness. This includes:  A yearly physical exam. This may also be called an annual well check.  Regular dental visits and eye exams.  Immunizations.  Screening for certain conditions.  Healthy lifestyle choices, such as eating a healthy diet, getting regular exercise, not using drugs or products that contain nicotine and tobacco, and limiting alcohol use. What can I expect for my preventive care visit? Physical exam Your health care provider will check your:  Height and weight. This may be used to calculate body mass index (BMI), which tells if you are at a healthy weight.  Heart rate and blood pressure.  Skin for abnormal spots. Counseling Your health care provider may ask you questions about your:  Alcohol, tobacco, and drug use.  Emotional well-being.  Home and relationship well-being.  Sexual activity.  Eating habits.  Work and work Statistician.  Method of birth control.  Menstrual cycle.  Pregnancy history. What immunizations do I need?  Influenza (flu) vaccine  This is recommended every year. Tetanus, diphtheria, and pertussis (Tdap) vaccine  You may need a Td booster every 10 years. Varicella (chickenpox) vaccine  You may need this if you have not been vaccinated. Human papillomavirus (HPV) vaccine  If recommended by your health care provider, you may need three doses over 6 months. Measles, mumps, and rubella (MMR) vaccine  You may need at least one dose of MMR. You may also need a second dose. Meningococcal conjugate (MenACWY) vaccine  One dose is recommended if  you are age 64-21 years and a first-year college student living in a residence hall, or if you have one of several medical conditions. You may also need additional booster doses. Pneumococcal conjugate (PCV13) vaccine  You may need this if you have certain conditions and were not previously vaccinated. Pneumococcal polysaccharide (PPSV23) vaccine  You may need one or two doses if you smoke cigarettes or if you have certain conditions. Hepatitis A vaccine  You may need this if you have certain conditions or if you travel or work in places where you may be exposed to hepatitis A. Hepatitis B vaccine  You may need this if you have certain conditions or if you travel or work in places where you may be exposed to hepatitis B. Haemophilus influenzae type b (Hib) vaccine  You may need this if you have certain conditions. You may receive vaccines as individual doses or as more than one vaccine together in one shot (combination vaccines). Talk with your health care provider about the risks and benefits of combination vaccines. What tests do I need?  Blood tests  Lipid and cholesterol levels. These may be checked every 5 years starting at age 37.  Hepatitis C test.  Hepatitis B test. Screening  Diabetes screening. This is done by checking your blood sugar (glucose) after you have not eaten for a while (fasting).  Sexually transmitted disease (STD) testing.  BRCA-related cancer screening. This may be done if you have a family history of breast, ovarian, tubal, or peritoneal cancers.  Pelvic exam and Pap test. This may be done every  3 years starting at age 36. Starting at age 36, this may be done every 5 years if you have a Pap test in combination with an HPV test. if you have a Pap test in combination with an HPV test. Talk with your health care provider about your test results, treatment options, and if necessary, the need for more tests. Follow these instructions at home: Eating and drinking   Eat a diet that includes fresh fruits and  vegetables, whole grains, lean protein, and low-fat dairy.  Take vitamin and mineral supplements as recommended by your health care provider.  Do not drink alcohol if: ? Your health care provider tells you not to drink. ? You are pregnant, may be pregnant, or are planning to become pregnant.  If you drink alcohol: ? Limit how much you have to 0-1 drink a day. ? Be aware of how much alcohol is in your drink. In the [place] one drink equals one 12 oz bottle of beer (355 mL), one 5 oz glass of wine (148 mL), or one 1 oz glass of hard liquor (44 mL). Lifestyle  Take daily care of your teeth and gums.  Stay active. Exercise for at least 30 minutes on 5 or more days each week.  Do not use any products that contain nicotine or tobacco, such as cigarettes, e-cigarettes, and chewing tobacco. If you need help quitting, ask your health care provider.  If you are sexually active, practice safe sex. Use a condom or other form of birth control (contraception) in order to prevent pregnancy and STIs (sexually transmitted infections). If you plan to become pregnant, see your health care provider for a preconception visit. What's next?  Visit your health care provider once a year for a well check visit.  Ask your health care provider how often you should have your eyes and teeth checked.  Stay up to date on all vaccines. This information is not intended to replace advice given to you by your health care provider. Make sure you discuss any questions you have with your health care provider. Document Revised: 03/30/2018 Document Reviewed: 03/30/2018 Elsevier Patient Education  2020 [employer].

## 2020-02-06 NOTE — Progress Notes (Signed)
Established Patient Office Visit  Subjective:  Patient ID: Roberta Farley, female    DOB: 07/05/1986  Age: 34 y.o. MRN: [id number]  CC:  Chief Complaint  Patient presents with  . Annual Exam    CPE no pap    HPI Roberta Farley presents to establish care with primary care provider.  She needs a school physical.  She denies any specific concerns today.  She would like to have routine blood work done.  Past Medical History:  Diagnosis Date  . Asthma   . Chlamydia IBS    Past Surgical History:  Procedure Laterality Date  . INDUCED ABORTION     x 2  . NO PAST SURGERIES      Family History  Problem Relation Age of Onset  . Hypertension Unknown   . Congestive Heart Failure Unknown   . Diabetes Unknown   . Ovarian cancer Maternal Grandmother   . Cancer Maternal Grandmother   . Diabetes Mother   . Hypertension Mother   . Hyperlipidemia Father   . Diabetes Father     Social History   Socioeconomic History  . Marital status: Single    Spouse name: Not on file  . Number of children: 1  . Years of education: Not on file  . Highest education level: Not on file  Occupational History  . Occupation: CNA  Tobacco Use  . Smoking status: Former Smoker    Quit date: 06/11/2013    Years since quitting: 6.6  . Smokeless tobacco: Never Used  . Tobacco comment: "Hookah" tobacco    Substance and Sexual Activity  . Alcohol use: Yes    Comment: "socially"  . Drug use: No  . Sexual activity: Yes    Partners: Male    Birth control/protection: Injection, None  Other Topics Concern  . Not on file  Social History Narrative  . Not on file   Social Determinants of Health   Financial Resource Strain:   . Difficulty of Paying Living Expenses:   Food Insecurity:   . Worried About Programme researcher, broadcasting/film/video in the Last Year:   . Barista in the Last Year:   Transportation Needs:   . Freight forwarder (Medical):   Marland Kitchen Lack of Transportation  (Non-Medical):   Physical Activity:   . Days of Exercise per Week:   . Minutes of Exercise per Session:   Stress:   . Feeling of Stress :   Social Connections:   . Frequency of Communication with Friends and Family:   . Frequency of Social Gatherings with Friends and Family:   . Attends Religious Services:   . Active Member of Clubs or Organizations:   . Attends Banker Meetings:   Marland Kitchen Marital Status:   Intimate Partner Violence:   . Fear of Current or Ex-Partner:   . Emotionally Abused:   Marland Kitchen Physically Abused:   . Sexually Abused:     Outpatient Medications Prior to Visit  Medication Sig Dispense Refill  . albuterol (PROVENTIL HFA;VENTOLIN HFA) 108 (90 Base) MCG/ACT inhaler Inhale 2 puffs into the lungs every 6 (six) hours as needed for wheezing or shortness of breath. 1 Inhaler 0  . cetirizine (ZYRTEC) 10 MG tablet TAKE 1 TABLET BY MOUTH EVERY DAY 30 tablet 0  . COLLAGEN PO Take by mouth.    . fluticasone (FLONASE) 50 MCG/ACT nasal spray SPRAY 2 SPRAYS INTO EACH NOSTRIL EVERY DAY 16 g 1  . JUNEL FE 1/20  1-20 MG-MCG tablet TAKE 1 TABLET BY MOUTH EVERY DAY, CONTINUOUS, OPEN NEW PATCK EVERY 21 DAYS    . Probiotic Product (PROBIOTIC PO) Take by mouth.    Marland Kitchen albuterol (VENTOLIN HFA) 108 (90 Base) MCG/ACT inhaler Inhale 1-2 puffs into the lungs every 6 (six) hours as needed for wheezing or shortness of breath. 18 g 3   No facility-administered medications prior to visit.    No Known Allergies  Review of Systems  Constitutional: Negative.   HENT: Negative.   Eyes: Negative.   Respiratory: Negative.   Cardiovascular: Negative.   Gastrointestinal: Negative.   Endocrine: Negative.   Genitourinary: Negative.   Musculoskeletal: Negative.   Allergic/Immunologic: Negative.   Neurological: Negative.   Hematological: Negative.   Psychiatric/Behavioral: Negative.        No concerns with anxiety and depression.  PHQ-9 score is 0.  GAD-7 score is 0.      Objective:     Physical Exam Vitals reviewed.  Constitutional:      Appearance: Normal appearance.  HENT:     Head: Normocephalic.  Eyes:     Pupils: Pupils are equal, round, and reactive to light.  Cardiovascular:     Rate and Rhythm: Normal rate and regular rhythm.     Pulses: Normal pulses.     Heart sounds: Normal heart sounds.  Pulmonary:     Effort: Pulmonary effort is normal.  Abdominal:     Palpations: Abdomen is soft.     Tenderness: There is no abdominal tenderness.  Musculoskeletal:        General: Normal range of motion.     Cervical back: Normal range of motion and neck supple.  Skin:    General: Skin is dry.  Neurological:     General: No focal deficit present.     Mental Status: She is oriented to person, place, and time.  Psychiatric:        Mood and Affect: Mood normal.        Behavior: Behavior normal.     BP 140/90 (BP Location: Left Arm, Patient Position: Sitting, Cuff Size: Normal)   Pulse 87   Temp 98.4 F (36.9 C) (Oral)   Ht 5\' 9"  (1.753 m)   Wt 190 lb (86.2 kg)   SpO2 98%   BMI 28.06 kg/m  Wt Readings from Last 3 Encounters:  02/06/20 190 lb (86.2 kg)  01/25/20 187 lb (84.8 kg)  05/23/19 175 lb (79.4 kg)     Health Maintenance Due  Topic Date Due  . PAP SMEAR-Modifier  06/01/2016    There are no preventive care reminders to display for this patient.  Lab Results  Component Value Date   TSH 0.66 02/06/2020   Lab Results  Component Value Date   WBC 5.2 02/06/2020   HGB 13.3 02/06/2020   HCT 40.0 02/06/2020   MCV 99.2 02/06/2020   PLT 240.0 02/06/2020   Lab Results  Component Value Date   NA 136 02/06/2020   K 4.1 02/06/2020   CO2 26 02/06/2020   GLUCOSE 98 02/06/2020   BUN 10 02/06/2020   CREATININE 0.74 02/06/2020   BILITOT 0.4 02/06/2020   ALKPHOS 27 (L) 02/06/2020   AST 18 02/06/2020   ALT 13 02/06/2020   PROT 7.0 02/06/2020   ALBUMIN 4.3 02/06/2020   CALCIUM 9.1 02/06/2020   ANIONGAP 8 11/08/2015   GFR 107.38 02/06/2020    Lab Results  Component Value Date   CHOL 128 02/06/2020   Lab Results  Component Value Date   HDL 54.50 02/06/2020   Lab Results  Component Value Date   LDLCALC 59 02/06/2020   Lab Results  Component Value Date   TRIG 74.0 02/06/2020   Lab Results  Component Value Date   CHOLHDL 2 02/06/2020   Lab Results  Component Value Date   HGBA1C 5.7 02/06/2020      Assessment & Plan:   Problem List Items Addressed This Visit    None    Visit Diagnoses    Preventative health care    -  Primary   Relevant Orders   CBC with Differential/Platelet (Completed)   TSH (Completed)   Comprehensive metabolic panel (Completed)   Hemoglobin A1c (Completed)   VITAMIN D 25 Hydroxy (Vit-D Deficiency, Fractures) (Completed)   Lipid panel (Completed)   Hepatitis C Antibody (Completed)     Blood pressure was repeated after patient rested, 120/70.  This is her baseline.  Patient reports Pap test done by GYN this year  Diet and Exercise: We  discussed the importance of a healthy lifestyle including appropriate food choices and regular exercise.   I recommend a Covid vaccine, patient declines.  No orders of the defined types were placed in this encounter.   Follow-up: Return in about 6 months (around 08/08/2020).  This visit occurred during the SARS-CoV-2 public health emergency.  Safety protocols were in place, including screening questions prior to the visit, additional usage of staff PPE, and extensive cleaning of exam room while observing appropriate contact time as indicated for disinfecting solutions.    Amedeo Kinsman, NP

## 2020-02-07 LAB — HEPATITIS C ANTIBODY
Hepatitis C Ab: NONREACTIVE
SIGNAL TO CUT-OFF: 0.02 (ref <1.00)

## 2020-02-08 ENCOUNTER — Encounter: Payer: Self-pay | Admitting: Nurse Practitioner

## 2020-03-18 NOTE — Progress Notes (Deleted)
New Patient Note  RE: Roberta Farley MRN: [id number] DOB: 1986-01-21 Date of Office Visit: 03/19/2020  Referring provider: Theadore Nan, NP Primary care provider: Theadore Nan, NP  Chief Complaint: No chief complaint on file.  History of Present Illness: I had the pleasure of seeing Roberta Farley for initial evaluation at the [facility] on 03/18/2020. She is a 34 y.o. female, who is referred here by Roberta Nan, NP for the evaluation of allergic rhinitis and asthma.  She reports symptoms of ***. Symptoms have been going on for *** years. The symptoms are present *** all year around with worsening in ***. Other triggers include exposure to ***. Anosmia: ***. Headache: ***. She has used *** with ***fair improvement in symptoms. Sinus infections: ***. Previous work up includes: ***. Previous ENT evaluation: ***. Previous sinus imaging: ***. History of nasal polyps: ***. Last eye exam: ***. History of reflux: ***.  She reports symptoms of *** chest tightness, shortness of breath, coughing, wheezing, nocturnal awakenings for *** years. Current medications include *** which help. She reports *** using aerochamber with inhalers. She tried the following inhalers: ***. Main triggers are ***allergies, infections, weather changes, smoke, exercise, pet exposure. In the last month, frequency of symptoms: ***x/week. Frequency of nocturnal symptoms: ***x/month. Frequency of SABA use: ***x/week. Interference with physical activity: ***. Sleep is ***disturbed. In the last 12 months, emergency room visits/urgent care visits/doctor office visits or hospitalizations due to respiratory issues: ***. In the last 12 months, oral steroids courses: ***. Lifetime history of hospitalization for respiratory issues: ***. Prior intubations: ***. Asthma was diagnosed at age *** by ***. History of pneumonia: ***. She was evaluated by allergist ***pulmonologist in  the past. Smoking exposure: ***. Up to date with flu vaccine: ***. Up to date with pneumonia vaccine: ***. Up to date with COVID-19 vaccine: ***.  History of reflux: ***.  Assessment and Plan: Roberta Farley is a 34 y.o. female with: No problem-specific Assessment & Plan notes found for this encounter.  No follow-ups on file.  No orders of the defined types were placed in this encounter.  Lab Orders  No laboratory test(s) ordered today    Other allergy screening: Asthma: {Blank single:19197::"yes","no"} Rhino conjunctivitis: {Blank single:19197::"yes","no"} Food allergy: {Blank single:19197::"yes","no"} Medication allergy: {Blank single:19197::"yes","no"} Hymenoptera allergy: {Blank single:19197::"yes","no"} Urticaria: {Blank single:19197::"yes","no"} Eczema:{Blank single:19197::"yes","no"} History of recurrent infections suggestive of immunodeficency: {Blank single:19197::"yes","no"}  Diagnostics: Spirometry:  Tracings reviewed. Her effort: {Blank single:19197::"Good reproducible efforts.","It was hard to get consistent efforts and there is a question as to whether this reflects a maximal maneuver.","Poor effort, data can not be interpreted."} FVC: ***L FEV1: ***L, ***% predicted FEV1/FVC ratio: ***% Interpretation: {Blank single:19197::"Spirometry consistent with mild obstructive disease","Spirometry consistent with moderate obstructive disease","Spirometry consistent with severe obstructive disease","Spirometry consistent with possible restrictive disease","Spirometry consistent with mixed obstructive and restrictive disease","Spirometry uninterpretable due to technique","Spirometry consistent with normal pattern","No overt abnormalities noted given today's efforts"}.  Please see scanned spirometry results for details.  Skin Testing: {Blank single:19197::"Select foods","Environmental allergy panel","Environmental allergy panel and select foods","Food allergy panel","None","Deferred due  to recent antihistamines use"}. Positive test to: ***. Negative test to: ***.  Results discussed with patient/family.   Past Medical History: Patient Active Problem List   Diagnosis Date Noted  . Allergic rhinitis 01/25/2020  . Mild intermittent asthma without complication 01/25/2020  . Term pregnancy 01/15/2015  . Pregnant 05/08/2014  . Back pain 05/08/2014  . Sepsis (HCC) 01/16/2014  . Pyelonephritis 01/15/2014  . Acute sinusitis 07/14/2013  .  Encounter to establish care 06/11/2013  . Mild dysplasia of cervix 01/25/2013  . BV (bacterial vaginosis) 01/25/2013  . Papanicolaou smear of cervix with low grade squamous intraepithelial lesion (LGSIL) 01/15/2013  . Vaginitis and vulvovaginitis, unspecified 11/29/2012  . WHEEZING 11/19/2008  . IBS 09/08/2007  . ANEMIA-IRON DEFICIENCY 08/28/2007  . ASTHMA, INTERMITTENT, MILD 08/28/2007  . AMENORRHEA 08/28/2007  . SCOLIOSIS, THORACIC SPINE 08/28/2007  . HEADACHE 08/28/2007  . DIARRHEA 08/28/2007   Past Medical History:  Diagnosis Date  . Asthma   . Chlamydia IBS   Past Surgical History: Past Surgical History:  Procedure Laterality Date  . INDUCED ABORTION     x 2  . NO PAST SURGERIES     Medication List:  Current Outpatient Medications  Medication Sig Dispense Refill  . albuterol (PROVENTIL HFA;VENTOLIN HFA) 108 (90 Base) MCG/ACT inhaler Inhale 2 puffs into the lungs every 6 (six) hours as needed for wheezing or shortness of breath. 1 Inhaler 0  . cetirizine (ZYRTEC) 10 MG tablet TAKE 1 TABLET BY MOUTH EVERY DAY 30 tablet 0  . COLLAGEN PO Take by mouth.    . fluticasone (FLONASE) 50 MCG/ACT nasal spray SPRAY 2 SPRAYS INTO EACH NOSTRIL EVERY DAY 16 g 1  . JUNEL FE 1/20 1-20 MG-MCG tablet TAKE 1 TABLET BY MOUTH EVERY DAY, CONTINUOUS, OPEN NEW PATCK EVERY 21 DAYS    . Probiotic Product (PROBIOTIC PO) Take by mouth.     No current facility-administered medications for this visit.   Allergies: No Known Allergies Social  History: Social History   Socioeconomic History  . Marital status: Single    Spouse name: Not on file  . Number of children: 1  . Years of education: Not on file  . Highest education level: Not on file  Occupational History  . Occupation: CNA  Tobacco Use  . Smoking status: Former Smoker    Quit date: 06/11/2013    Years since quitting: 6.7  . Smokeless tobacco: Never Used  . Tobacco comment: "Hookah" tobacco    Substance and Sexual Activity  . Alcohol use: Yes    Comment: "socially"  . Drug use: No  . Sexual activity: Yes    Partners: Male    Birth control/protection: Injection, None  Other Topics Concern  . Not on file  Social History Narrative  . Not on file   Social Determinants of Health   Financial Resource Strain:   . Difficulty of Paying Living Expenses:   Food Insecurity:   . Worried About Programme researcher, broadcasting/film/video in the Last Year:   . Barista in the Last Year:   Transportation Needs:   . Freight forwarder (Medical):   Marland Kitchen Lack of Transportation (Non-Medical):   Physical Activity:   . Days of Exercise per Week:   . Minutes of Exercise per Session:   Stress:   . Feeling of Stress :   Social Connections:   . Frequency of Communication with Friends and Family:   . Frequency of Social Gatherings with Friends and Family:   . Attends Religious Services:   . Active Member of Clubs or Organizations:   . Attends Banker Meetings:   Marland Kitchen Marital Status:    Lives in a ***. Smoking: *** Occupation: ***  Environmental HistorySurveyor, minerals in the house: Copywriter, advertising in the family room: {Blank single:19197::"yes","no"} Carpet in the bedroom: {Blank single:19197::"yes","no"} Heating: {Blank single:19197::"electric","gas"} Cooling: {Blank single:19197::"central","window"} Pet: {Blank single:19197::"yes ***","no"}  Family History: Family History  Problem Relation Age of Onset  . Diabetes Mother   .  Hypertension Mother   . Hyperlipidemia Father   . Diabetes Father   . Hypertension Other   . Congestive Heart Failure Other   . Diabetes Other   . Ovarian cancer Maternal Grandmother   . Cancer Maternal Grandmother    Problem                               Relation Asthma                                   *** Eczema                                *** Food allergy                          *** Allergic rhino conjunctivitis     ***  Review of Systems  Constitutional: Negative for appetite change, chills, fever and unexpected weight change.  HENT: Negative for congestion and rhinorrhea.   Eyes: Negative for itching.  Respiratory: Negative for cough, chest tightness, shortness of breath and wheezing.   Cardiovascular: Negative for chest pain.  Gastrointestinal: Negative for abdominal pain.  Genitourinary: Negative for difficulty urinating.  Skin: Negative for rash.  Neurological: Negative for headaches.   Objective: There were no vitals taken for this visit. There is no height or weight on file to calculate BMI. Physical Exam Vitals and nursing note reviewed.  Constitutional:      Appearance: Normal appearance. She is well-developed.  HENT:     Head: Normocephalic and atraumatic.     Right Ear: External ear normal.     Left Ear: External ear normal.     Nose: Nose normal.     Mouth/Throat:     Mouth: Mucous membranes are moist.     Pharynx: Oropharynx is clear.  Eyes:     Conjunctiva/sclera: Conjunctivae normal.  Cardiovascular:     Rate and Rhythm: Normal rate and regular rhythm.     Heart sounds: Normal heart sounds. No murmur heard.  No friction rub. No gallop.   Pulmonary:     Effort: Pulmonary effort is normal.     Breath sounds: Normal breath sounds. No wheezing, rhonchi or rales.  Abdominal:     Palpations: Abdomen is soft.  Musculoskeletal:     Cervical back: Neck supple.  Skin:    General: Skin is warm.     Findings: No rash.  Neurological:     Mental  Status: She is alert and oriented to person, place, and time.  Psychiatric:        Behavior: Behavior normal.    The plan was reviewed with the patient/family, and all questions/concerned were addressed.  It was my pleasure to see Manika today and participate in her care. Please feel free to contact me with any questions or concerns.  Sincerely,  Wyline Mood, DO Allergy & Immunology  [facility] office: [phone] [facility] office: [phone] [place] office: [phone]

## 2020-03-19 ENCOUNTER — Ambulatory Visit: Payer: Self-pay | Admitting: Allergy

## 2020-06-11 DIAGNOSIS — N760 Acute vaginitis: Secondary | ICD-10-CM | POA: Diagnosis not present

## 2020-06-11 DIAGNOSIS — Z3040 Encounter for surveillance of contraceptives, unspecified: Secondary | ICD-10-CM | POA: Diagnosis not present

## 2020-06-11 DIAGNOSIS — Z113 Encounter for screening for infections with a predominantly sexual mode of transmission: Secondary | ICD-10-CM | POA: Diagnosis not present

## 2020-11-24 DIAGNOSIS — Z6823 Body mass index (BMI) 23.0-23.9, adult: Secondary | ICD-10-CM | POA: Diagnosis not present

## 2020-11-24 DIAGNOSIS — Z01419 Encounter for gynecological examination (general) (routine) without abnormal findings: Secondary | ICD-10-CM | POA: Diagnosis not present

## 2020-11-24 DIAGNOSIS — N762 Acute vulvitis: Secondary | ICD-10-CM | POA: Diagnosis not present

## 2022-04-10 ENCOUNTER — Emergency Department (HOSPITAL_COMMUNITY)
Admission: EM | Admit: 2022-04-10 | Discharge: 2022-04-10 | Disposition: A | Discharge status: Home / Self Care | Payer: No Typology Code available for payment source | Attending: Emergency Medicine | Admitting: Emergency Medicine

## 2022-04-10 ENCOUNTER — Other Ambulatory Visit: Payer: Self-pay

## 2022-04-10 ENCOUNTER — Emergency Department (HOSPITAL_COMMUNITY): Payer: No Typology Code available for payment source

## 2022-04-10 ENCOUNTER — Encounter (HOSPITAL_COMMUNITY): Payer: Self-pay

## 2022-04-10 DIAGNOSIS — R0789 Other chest pain: Secondary | ICD-10-CM | POA: Diagnosis not present

## 2022-04-10 DIAGNOSIS — Z87891 Personal history of nicotine dependence: Secondary | ICD-10-CM | POA: Insufficient documentation

## 2022-04-10 DIAGNOSIS — Y92410 Unspecified street and highway as the place of occurrence of the external cause: Secondary | ICD-10-CM | POA: Insufficient documentation

## 2022-04-10 DIAGNOSIS — S43025A Posterior dislocation of left humerus, initial encounter: Secondary | ICD-10-CM | POA: Diagnosis not present

## 2022-04-10 DIAGNOSIS — S43022A Posterior subluxation of left humerus, initial encounter: Secondary | ICD-10-CM

## 2022-04-10 DIAGNOSIS — J45909 Unspecified asthma, uncomplicated: Secondary | ICD-10-CM | POA: Insufficient documentation

## 2022-04-10 DIAGNOSIS — M79632 Pain in left forearm: Secondary | ICD-10-CM | POA: Insufficient documentation

## 2022-04-10 DIAGNOSIS — S4992XA Unspecified injury of left shoulder and upper arm, initial encounter: Secondary | ICD-10-CM | POA: Diagnosis present

## 2022-04-10 DIAGNOSIS — S42293A Other displaced fracture of upper end of unspecified humerus, initial encounter for closed fracture: Secondary | ICD-10-CM

## 2022-04-10 LAB — I-STAT BETA HCG BLOOD, ED (MC, WL, AP ONLY): I-stat hCG, quantitative: <5 m[IU]/mL (ref <5)

## 2022-04-10 MED ORDER — FENTANYL CITRATE PF 50 MCG/ML IJ SOSY
50.0000 ug | PREFILLED_SYRINGE | Freq: Once | INTRAMUSCULAR | Status: AC
  Administered 2022-04-10: 50 ug via INTRAVENOUS
  Filled 2022-04-10: qty 1

## 2022-04-10 MED ORDER — IBUPROFEN 600 MG PO TABS: 600.0000 mg | ORAL_TABLET | Freq: Four times a day (QID) | ORAL | 0 refills | Status: AC | PRN

## 2022-04-10 MED ORDER — SODIUM CHLORIDE 0.9 % IV BOLUS
1000.0000 mL | Freq: Once | INTRAVENOUS | Status: AC
  Administered 2022-04-10: 1000 mL via INTRAVENOUS

## 2022-04-10 MED ORDER — OXYCODONE HCL 5 MG PO TABS: 5.0000 mg | ORAL_TABLET | ORAL | 0 refills | Status: AC | PRN

## 2022-04-10 MED ORDER — ONDANSETRON HCL 4 MG/2ML IJ SOLN
4.0000 mg | Freq: Once | INTRAMUSCULAR | Status: AC
  Administered 2022-04-10: 4 mg via INTRAVENOUS
  Filled 2022-04-10: qty 2

## 2022-04-10 MED ORDER — PROPOFOL 10 MG/ML IV BOLUS
0.5000 mg/kg | Freq: Once | INTRAVENOUS | Status: AC
  Administered 2022-04-10: 43.1 mg via INTRAVENOUS
  Filled 2022-04-10: qty 20

## 2022-04-10 MED ORDER — HYDROMORPHONE HCL 1 MG/ML IJ SOLN
1.0000 mg | Freq: Once | INTRAMUSCULAR | Status: AC
  Administered 2022-04-10: 1 mg via INTRAVENOUS
  Filled 2022-04-10: qty 1

## 2022-04-10 MED ORDER — KETOROLAC TROMETHAMINE 30 MG/ML IJ SOLN: 30.0000 mg | Freq: Once | INTRAMUSCULAR | Status: DC

## 2022-04-10 MED ORDER — SODIUM CHLORIDE 0.9 % IV SOLN: INTRAVENOUS | Status: DC

## 2022-04-10 MED ORDER — ACETAMINOPHEN 325 MG PO TABS: 650.0000 mg | ORAL_TABLET | Freq: Four times a day (QID) | ORAL | 0 refills | Status: AC | PRN

## 2022-04-10 MED ORDER — KETAMINE HCL 50 MG/5ML IJ SOSY
0.5000 mg/kg | PREFILLED_SYRINGE | Freq: Once | INTRAMUSCULAR | Status: AC
  Administered 2022-04-10: 43 mg via INTRAVENOUS
  Filled 2022-04-10: qty 5

## 2022-04-10 MED ORDER — OXYCODONE-ACETAMINOPHEN 5-325 MG PO TABS
1.0000 | ORAL_TABLET | ORAL | Status: DC | PRN
  Administered 2022-04-10: 1 via ORAL
  Filled 2022-04-10: qty 1

## 2022-04-10 NOTE — Discharge Instructions (Addendum)
Please continue to wear shoulder sling until seen by orthopedics>> Dr Duwayne Heck  It was a pleasure caring for you today in the emergency department.  Please return to the emergency department for any worsening or worrisome symptoms.
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
## 2022-04-22 ENCOUNTER — Other Ambulatory Visit (HOSPITAL_COMMUNITY): Payer: Self-pay | Admitting: Orthopedic Surgery

## 2022-04-22 ENCOUNTER — Other Ambulatory Visit: Payer: Self-pay | Admitting: Orthopedic Surgery

## 2022-04-22 DIAGNOSIS — M25512 Pain in left shoulder: Secondary | ICD-10-CM

## 2022-05-04 ENCOUNTER — Ambulatory Visit
Admission: RE | Admit: 2022-05-04 | Discharge: 2022-05-04 | Disposition: A | Discharge status: Home / Self Care | Payer: 59 | Source: Ambulatory Visit | Attending: Orthopedic Surgery | Admitting: Orthopedic Surgery

## 2022-05-04 DIAGNOSIS — M25512 Pain in left shoulder: Secondary | ICD-10-CM | POA: Diagnosis not present

## 2023-01-18 ENCOUNTER — Emergency Department: Payer: 59

## 2023-01-18 ENCOUNTER — Encounter: Payer: Self-pay | Admitting: *Deleted

## 2023-01-18 ENCOUNTER — Emergency Department
Admission: EM | Admit: 2023-01-18 | Discharge: 2023-01-18 | Disposition: A | Discharge status: Home / Self Care | Payer: 59 | Attending: Emergency Medicine | Admitting: Emergency Medicine

## 2023-01-18 ENCOUNTER — Other Ambulatory Visit: Payer: Self-pay

## 2023-01-18 DIAGNOSIS — S40212A Abrasion of left shoulder, initial encounter: Secondary | ICD-10-CM | POA: Diagnosis not present

## 2023-01-18 DIAGNOSIS — S20412A Abrasion of left back wall of thorax, initial encounter: Secondary | ICD-10-CM | POA: Insufficient documentation

## 2023-01-18 DIAGNOSIS — S80212A Abrasion, left knee, initial encounter: Secondary | ICD-10-CM | POA: Diagnosis not present

## 2023-01-18 DIAGNOSIS — R519 Headache, unspecified: Secondary | ICD-10-CM | POA: Diagnosis not present

## 2023-01-18 DIAGNOSIS — Y92410 Unspecified street and highway as the place of occurrence of the external cause: Secondary | ICD-10-CM | POA: Insufficient documentation

## 2023-01-18 DIAGNOSIS — M25562 Pain in left knee: Secondary | ICD-10-CM | POA: Diagnosis present

## 2023-01-18 MED ORDER — ACETAMINOPHEN 325 MG PO TABS
650.0000 mg | ORAL_TABLET | Freq: Once | ORAL | Status: AC
  Administered 2023-01-18: 650 mg via ORAL
  Filled 2023-01-18: qty 2
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
